# Patient Record
Sex: Female | Born: 1985 | Race: White | Hispanic: No | Smoking: Never smoker
Health system: Southern US, Community
[De-identification: ages and names within clinical notes are randomized; demographics above are authoritative.]

## PROBLEM LIST (undated history)

## (undated) DIAGNOSIS — Z87442 Personal history of urinary calculi: Secondary | ICD-10-CM

## (undated) DIAGNOSIS — E7800 Pure hypercholesterolemia, unspecified: Secondary | ICD-10-CM

## (undated) DIAGNOSIS — N1330 Unspecified hydronephrosis: Secondary | ICD-10-CM

## (undated) DIAGNOSIS — R109 Unspecified abdominal pain: Secondary | ICD-10-CM

## (undated) DIAGNOSIS — M549 Dorsalgia, unspecified: Secondary | ICD-10-CM

## (undated) DIAGNOSIS — N289 Disorder of kidney and ureter, unspecified: Secondary | ICD-10-CM

## (undated) DIAGNOSIS — N390 Urinary tract infection, site not specified: Secondary | ICD-10-CM

## (undated) DIAGNOSIS — F419 Anxiety disorder, unspecified: Secondary | ICD-10-CM

## (undated) HISTORY — DX: Unspecified hydronephrosis: N13.30

## (undated) HISTORY — DX: Anxiety disorder, unspecified: F41.9

## (undated) HISTORY — PX: STONE EXTRACTION WITH BASKET: SHX5318

## (undated) HISTORY — PX: TUBAL LIGATION: SHX77

## (undated) HISTORY — DX: Unspecified abdominal pain: R10.9

## (undated) HISTORY — DX: Pure hypercholesterolemia, unspecified: E78.00

## (undated) HISTORY — PX: ABDOMINAL HYSTERECTOMY: SHX81

## (undated) HISTORY — DX: Urinary tract infection, site not specified: N39.0

---

## 2008-07-23 ENCOUNTER — Emergency Department (HOSPITAL_COMMUNITY): Admission: EM | Admit: 2008-07-23 | Discharge: 2008-07-23 | Payer: Self-pay | Admitting: Emergency Medicine

## 2008-07-28 ENCOUNTER — Ambulatory Visit (HOSPITAL_COMMUNITY): Admission: RE | Admit: 2008-07-28 | Discharge: 2008-07-28 | Payer: Self-pay | Admitting: Urology

## 2008-07-30 ENCOUNTER — Ambulatory Visit (HOSPITAL_COMMUNITY): Admission: RE | Admit: 2008-07-30 | Discharge: 2008-07-30 | Payer: Self-pay | Admitting: Urology

## 2008-07-31 ENCOUNTER — Ambulatory Visit (HOSPITAL_COMMUNITY): Admission: RE | Admit: 2008-07-31 | Discharge: 2008-07-31 | Payer: Self-pay | Admitting: Urology

## 2008-08-04 ENCOUNTER — Ambulatory Visit (HOSPITAL_COMMUNITY): Admission: RE | Admit: 2008-08-04 | Discharge: 2008-08-04 | Payer: Self-pay | Admitting: Urology

## 2008-08-11 ENCOUNTER — Ambulatory Visit (HOSPITAL_COMMUNITY): Admission: RE | Admit: 2008-08-11 | Discharge: 2008-08-11 | Payer: Self-pay | Admitting: Urology

## 2008-08-11 ENCOUNTER — Encounter (INDEPENDENT_AMBULATORY_CARE_PROVIDER_SITE_OTHER): Payer: Self-pay | Admitting: Urology

## 2009-05-04 ENCOUNTER — Ambulatory Visit (HOSPITAL_COMMUNITY): Admission: RE | Admit: 2009-05-04 | Discharge: 2009-05-04 | Payer: Self-pay | Admitting: Urology

## 2009-11-02 ENCOUNTER — Ambulatory Visit (HOSPITAL_COMMUNITY): Admission: RE | Admit: 2009-11-02 | Discharge: 2009-11-02 | Payer: Self-pay | Admitting: Urology

## 2010-11-01 LAB — URINE MICROSCOPIC-ADD ON

## 2010-11-01 LAB — BASIC METABOLIC PANEL
BUN: 9 mg/dL (ref 6–23)
CO2: 23 mEq/L (ref 19–32)
Calcium: 9.4 mg/dL (ref 8.4–10.5)
Calcium: 9.3 mg/dL (ref 8.4–10.5)
Chloride: 105 mEq/L (ref 96–112)
Creatinine, Ser: 0.77 mg/dL (ref 0.4–1.2)
Creatinine, Ser: 0.65 mg/dL (ref 0.4–1.2)
GFR calc non Af Amer: >60 mL/min (ref >60)
Glucose, Bld: 105 mg/dL — ABNORMAL HIGH (ref 70–99)
Potassium: 4.4 mEq/L (ref 3.5–5.1)
Sodium: 134 mEq/L — ABNORMAL LOW (ref 135–145)

## 2010-11-01 LAB — URINALYSIS, ROUTINE W REFLEX MICROSCOPIC
Bilirubin Urine: NEGATIVE
Ketones, ur: NEGATIVE mg/dL
Protein, ur: 30 mg/dL — AB

## 2010-11-01 LAB — CBC
HCT: 34.7 % — ABNORMAL LOW (ref 36.0–46.0)
Hemoglobin: 11.5 g/dL — ABNORMAL LOW (ref 12.0–15.0)
RBC: 4.09 MIL/uL (ref 3.87–5.11)

## 2010-11-01 LAB — HCG, SERUM, QUALITATIVE: Preg, Serum: NEGATIVE

## 2010-11-02 ENCOUNTER — Ambulatory Visit (HOSPITAL_COMMUNITY)
Admission: RE | Admit: 2010-11-02 | Discharge: 2010-11-02 | Disposition: A | Discharge status: Home / Self Care | Payer: Managed Care, Other (non HMO) | Source: Ambulatory Visit | Attending: Urology | Admitting: Urology

## 2010-11-02 ENCOUNTER — Other Ambulatory Visit (HOSPITAL_COMMUNITY): Payer: Self-pay | Admitting: Urology

## 2010-11-02 ENCOUNTER — Encounter (HOSPITAL_COMMUNITY): Payer: Self-pay

## 2010-11-02 DIAGNOSIS — Z87442 Personal history of urinary calculi: Secondary | ICD-10-CM | POA: Insufficient documentation

## 2010-11-02 DIAGNOSIS — R1031 Right lower quadrant pain: Secondary | ICD-10-CM | POA: Insufficient documentation

## 2010-11-02 DIAGNOSIS — R109 Unspecified abdominal pain: Secondary | ICD-10-CM

## 2010-11-30 NOTE — H&P (Signed)
[name], [name]              ACCOUNT NO.:  [id number]   MEDICAL RECORD NO.:  [id number]          PATIENT TYPE:  AMB   LOCATION:  DAY                           FACILITY:  [facility]   PHYSICIAN:  Dennie Maizes, M.D.   DATE OF BIRTH:  09-12-1985   DATE OF ADMISSION:  07/30/2008  DATE OF DISCHARGE:  LH                              HISTORY & PHYSICAL   CHIEF COMPLAINT:  Left flank pain, nausea and vomiting.   HISTORY OF PRESENT ILLNESS:  This 25 year old female has past history of  recurrent urolithiasis.  She experienced sudden onset of severe left  flank pain radiating to the front associated with nausea and vomiting on  July 23, 2008.  She went to the Emergency Room at [facility].  Microscopic hematuria was noted.  She was evaluated with a noncontrast  CT scan of abdomen and pelvis.  This revealed 2 nonobstructing right  renal calculi, each measuring about 2 mm in size.  There was a 5 mm  sized left ureteral stone with obstruction and hydronephrosis.  The  patient was unable to pass the stone.  She has some pain relief at  present.  Repeat x-ray of the KUB area revealed a 6 x 5 mm sized stone  in the left ureterovesical junction area.  The patient is brought to the  short-stay center today for extracorporeal shock wave lithotripsy of  obstructing left distal ureteral calculus.   PAST MEDICAL HISTORY:  1. Recurrent urolithiasis.  2. Status post tubal ligation.   MEDICATIONS:  Hydrocodone with APAP, Flomax.   ALLERGIES:  None.   FAMILY HISTORY:  Positive for lung cancer.   PHYSICAL EXAMINATION:  GENERAL:  The patient is comfortable at time of  examination.  Height is 5 feet 2 inches, weight 205 pounds.  HEENT:  Normal.  LUNGS:  Clear to auscultation.  HEART:  Regular rate and rhythm.  No murmurs.  ABDOMEN:  Soft, no palpable flank mass.  Mild left costovertebral angle  tenderness is noted.  Bladder not palpable.   IMPRESSION:  Left distal ureteral calculus with  obstruction with 6 x 5  mm in size, left hydronephrosis, left flank pain, nonobstructing right  renal calculi.   PLAN:  Extracorporeal shock wave lithotripsy of obstructing left distal  ureteral calculus with IV sedation in the hospital.  I informed the  patient regarding diagnosis, operative details, alternative treatments,  outcome, possible risks and complications.  She has agreed for the  procedure to be done.  If she is unable to pass the stone fragments, she  may need ureteroscopy stone extraction.  This was explained to the  patient in detail.      Dennie Maizes, M.D.  Electronically Signed     SK/MEDQ  D:  07/29/2008  T:  07/29/2008  Job:  [id number]   cc:   [facility], [facility]

## 2010-11-30 NOTE — H&P (Signed)
NAMEKRISTYNA, Kirk NO.:  [id number]   MEDICAL RECORD NO.:  [id number]          PATIENT TYPE:  AMB   LOCATION:  DAY                           FACILITY:  [facility]   PHYSICIAN:  Dennie Maizes, M.D.   DATE OF BIRTH:  May 15, 1986   DATE OF ADMISSION:  DATE OF DISCHARGE:  LH                              HISTORY & PHYSICAL   She is coming to the Short Stay Center on 08/11/2008.   CHIEF COMPLAINT:  Left flank pain, left distal ureteral calculus.   HISTORY OF PRESENT ILLNESS:  This 25 year old female has a past history  of recurrent urolithiasis.  She was evaluated for left flank pain.  CT  scan of abdomen and pelvis revealed a 6-mm size left distal ureteral  calculus obstruction and hydronephrosis.  The patient has been treated  with ESL of the stone on July 30, 2008.  Follow-up x-rays revealed  that the stone has not been broken well.  She has not passed any stone  fragment in the urine.  She continues to have intermittent left flank  pain.  I discussed with the patient regarding management options.  She  has decided to undergo cystoscopy, left retrograde pyelogram, left  ureteroscopy, stone extraction and stent placement.  She denied having  any fever, chills, voiding difficulty or gross hematuria at present.   PAST MEDICAL HISTORY:  History of recurrent urolithiasis, status post  tubal ligation.   MEDICATIONS:  Cipro, hydrocodone with APAP.   ALLERGIES:  None.   FAMILY HISTORY:  Positive for lung cancer   EXAMINATION:  HEAD/EYES/EARS/NOSE AND THROAT:  Normal.  LUNGS:  Clear to auscultation.  HEART:  Regular rate and rhythm, no murmurs.  ABDOMEN:  Soft, no palpable flank mass.  Mild left costovertebral angle  tenderness was noted.  Bladder not palpable.   IMPRESSION:  Left distal ureteral calculus with obstruction (6-mm x 5-  mm) post ESL, left hydronephrosis, left renal colic.   PLAN:  Cystoscopy, left retrograde pyelogram, left ureteroscopy stone  extraction with stent placement under anesthesia in the Short Stay  Center.  I have informed the patient regarding the diagnosis, operative  details, alternate treatments and outcome, possible risks and  complications and she has agreed for the procedure to be done.      Dennie Maizes, M.D.  Electronically Signed     SK/MEDQ  D:  08/10/2008  T:  08/10/2008  Job:  [id number]   cc:   [facility]

## 2010-11-30 NOTE — Op Note (Signed)
[name], [name]              ACCOUNT NO.:  [id number]   MEDICAL RECORD NO.:  [id number]          PATIENT TYPE:  AMB   LOCATION:  DAY                           FACILITY:  [facility]   PHYSICIAN:  Dennie Maizes, M.D.   DATE OF BIRTH:  07-17-1986   DATE OF PROCEDURE:  08/11/2008  DATE OF DISCHARGE:                               OPERATIVE REPORT   PREOPERATIVE DIAGNOSES:  Left distal ureteral calculus with obstruction  with left renal colic and left hydronephrosis.   POSTOPERATIVE DIAGNOSES:  Left distal ureteral calculus with obstruction  with left renal colic and left hydronephrosis.   OPERATIVE PROCEDURES:  Cystoscopy, left retrograde pyelogram, left  ureteroscopic stone extraction, and left ureteral stent placement.   ANESTHESIA:  General.   SURGEON:  Dennie Maizes, MD   ESTIMATED BLOOD LOSS:  Minimal.   DRAINS:  6 French x 26 cm left ureteric stent with a string.   SPECIMEN:  Left ureteral calculus which was sent for chemical analysis.   COMPLICATIONS:  None.   INDICATIONS FOR PROCEDURE:  This 25 year old female has left flank pain.  Evaluation revealed a 6 x 10 mm left distal ureteral calculus with  obstructive hydronephrosis.  She was treated with ESWL on the distal  ureteral calculus about 2 weeks ago.  The stone was not fragmented and  she has persistent pain.  She was brought to the day hospital today for  cystoscopy, left retrograde pyelogram, left ureteroscopy, stone  extraction, and stent placement.   DESCRIPTION OF THE PROCEDURE:  General anesthesia was induced and the  patient was placed on the OR table in the dorsal lithotomy position.  The lower abdomen and genitalia were prepped and draped in a sterile  fashion.  Cystoscopy was done with a 22-French scope.  At first, the  bladder was normal.  The stone was found to be protruding from the left  ureteral orifice.  Stone was impacted in the left ureteral orifice.  I  tried to pass a guidewire and I was  unsuccessful.  The cystoscope was  removed.  A8.5-French rigid ureteroscope was then inserted into the  bladder.  With the tip of the instrument the stone was pushed into the l  ureter.  Under direct vision, a 0.038 inch  Bentson guidewire was  inserted and then advanced in the left renal pelvis.  Distal ureter was  then dilated using 18-French balloon dilation catheter.  The  ureteroscope was then reinserted into the bladder.  The stone was seen  at the level about 1 cm above the ureteral orifice.  The stone was  engaged in a wire basket and removed without any difficulty.  An open-  ended catheter was then passed over the guidewire.  The collecting  system was filled with contrast and a retrograde pyelogram was done.  With fluoroscopy guidance, a guidewire was then inserted into the  left renal pelvis.  A 6 French x 26 cm stent with a string was inserted  over the guidewire and placed in the left collecting system.  The  instruments were removed.  The patient was then transferred to  the PACU  in a satisfactory condition.      Dennie Maizes, M.D.  Electronically Signed     SK/MEDQ  D:  08/11/2008  T:  08/12/2008  Job:  [id number]

## 2011-07-26 ENCOUNTER — Other Ambulatory Visit (HOSPITAL_COMMUNITY): Payer: Self-pay | Admitting: Urology

## 2011-07-26 ENCOUNTER — Ambulatory Visit (HOSPITAL_COMMUNITY)
Admission: RE | Admit: 2011-07-26 | Discharge: 2011-07-26 | Disposition: A | Discharge status: Home / Self Care | Payer: Managed Care, Other (non HMO) | Source: Ambulatory Visit | Attending: Urology | Admitting: Urology

## 2011-07-26 DIAGNOSIS — Z87442 Personal history of urinary calculi: Secondary | ICD-10-CM | POA: Insufficient documentation

## 2011-07-26 DIAGNOSIS — R109 Unspecified abdominal pain: Secondary | ICD-10-CM

## 2011-07-26 DIAGNOSIS — M545 Low back pain, unspecified: Secondary | ICD-10-CM | POA: Insufficient documentation

## 2011-08-03 ENCOUNTER — Other Ambulatory Visit (HOSPITAL_COMMUNITY): Payer: Managed Care, Other (non HMO)

## 2011-08-04 ENCOUNTER — Ambulatory Visit (HOSPITAL_COMMUNITY)
Admission: RE | Admit: 2011-08-04 | Discharge: 2011-08-04 | Disposition: A | Discharge status: Home / Self Care | Payer: Managed Care, Other (non HMO) | Source: Ambulatory Visit | Attending: Urology | Admitting: Urology

## 2011-08-04 DIAGNOSIS — R1031 Right lower quadrant pain: Secondary | ICD-10-CM | POA: Insufficient documentation

## 2011-08-04 DIAGNOSIS — R109 Unspecified abdominal pain: Secondary | ICD-10-CM

## 2011-08-04 DIAGNOSIS — N200 Calculus of kidney: Secondary | ICD-10-CM | POA: Insufficient documentation

## 2011-08-04 DIAGNOSIS — R1032 Left lower quadrant pain: Secondary | ICD-10-CM | POA: Insufficient documentation

## 2011-09-17 ENCOUNTER — Emergency Department (HOSPITAL_COMMUNITY): Payer: Managed Care, Other (non HMO)

## 2011-09-17 ENCOUNTER — Encounter (HOSPITAL_COMMUNITY): Payer: Self-pay

## 2011-09-17 ENCOUNTER — Emergency Department (HOSPITAL_COMMUNITY)
Admission: EM | Admit: 2011-09-17 | Discharge: 2011-09-17 | Disposition: A | Discharge status: Home / Self Care | Payer: Managed Care, Other (non HMO) | Attending: Emergency Medicine | Admitting: Emergency Medicine

## 2011-09-17 DIAGNOSIS — M549 Dorsalgia, unspecified: Secondary | ICD-10-CM | POA: Insufficient documentation

## 2011-09-17 DIAGNOSIS — R109 Unspecified abdominal pain: Secondary | ICD-10-CM | POA: Insufficient documentation

## 2011-09-17 DIAGNOSIS — R110 Nausea: Secondary | ICD-10-CM | POA: Insufficient documentation

## 2011-09-17 DIAGNOSIS — R10819 Abdominal tenderness, unspecified site: Secondary | ICD-10-CM | POA: Insufficient documentation

## 2011-09-17 DIAGNOSIS — R509 Fever, unspecified: Secondary | ICD-10-CM | POA: Insufficient documentation

## 2011-09-17 HISTORY — DX: Disorder of kidney and ureter, unspecified: N28.9

## 2011-09-17 LAB — URINALYSIS, ROUTINE W REFLEX MICROSCOPIC
Bilirubin Urine: NEGATIVE
Hgb urine dipstick: NEGATIVE
Ketones, ur: NEGATIVE mg/dL
Nitrite: NEGATIVE
Specific Gravity, Urine: 1.025 (ref 1.005–1.030)
Urobilinogen, UA: 0.2 mg/dL (ref 0.0–1.0)
pH: 6.5 (ref 5.0–8.0)

## 2011-09-17 LAB — URINE MICROSCOPIC-ADD ON

## 2011-09-17 MED ORDER — SODIUM CHLORIDE 0.9 % IV SOLN
Freq: Once | INTRAVENOUS | Status: AC
  Administered 2011-09-17: 18:00:00 via INTRAVENOUS

## 2011-09-17 MED ORDER — ONDANSETRON HCL 4 MG/2ML IJ SOLN
4.0000 mg | Freq: Once | INTRAMUSCULAR | Status: AC
  Administered 2011-09-17: 4 mg via INTRAVENOUS
  Filled 2011-09-17: qty 2

## 2011-09-17 MED ORDER — HYDROCODONE-ACETAMINOPHEN 5-325 MG PO TABS: 1.0000 | ORAL_TABLET | Freq: Four times a day (QID) | ORAL | Status: AC | PRN

## 2011-09-17 MED ORDER — CIPROFLOXACIN HCL 500 MG PO TABS: 500.0000 mg | ORAL_TABLET | Freq: Two times a day (BID) | ORAL | Status: AC

## 2011-09-17 MED ORDER — CIPROFLOXACIN HCL 250 MG PO TABS
500.0000 mg | ORAL_TABLET | Freq: Once | ORAL | Status: AC
  Administered 2011-09-17: 500 mg via ORAL
  Filled 2011-09-17: qty 2

## 2011-09-17 MED ORDER — PROMETHAZINE HCL 25 MG PO TABS: 25.0000 mg | ORAL_TABLET | Freq: Four times a day (QID) | ORAL | Status: DC | PRN

## 2011-09-17 MED ORDER — HYDROMORPHONE HCL PF 1 MG/ML IJ SOLN
1.0000 mg | Freq: Once | INTRAMUSCULAR | Status: AC
  Administered 2011-09-17: 1 mg via INTRAVENOUS
  Filled 2011-09-17: qty 1

## 2011-09-17 NOTE — ED Notes (Signed)
Complain of pain in left flank and nausea. Has history of kidney stones

## 2011-09-17 NOTE — ED Notes (Signed)
Patient transported to X-ray 

## 2011-09-17 NOTE — Discharge Instructions (Signed)
Follow up with dr. javaid next week °

## 2011-09-17 NOTE — ED Notes (Signed)
Pt with known kidney stone to left flank but today has pain to right flank as well but not as much as left, +nausea, denies vomiting or diarrhea, states two CT's as of Jan. 2013

## 2011-09-17 NOTE — ED Provider Notes (Signed)
History    Scribed for Angela Lennert, MD, the patient was seen in room [facility]17/[facility]. This chart was scribed by [name]. Pt was seen at 5:50 PM.    CSN: [id number]  Arrival date & time 09/17/11  1647   First MD Initiated Contact with Patient 09/17/11 1747      Chief Complaint  Patient presents with  . Flank Pain    (Consider location/radiation/quality/duration/timing/severity/associated sxs/prior treatment) Patient is a 26 y.o. female presenting with flank pain.  Flank Pain This is a recurrent problem. The current episode started 3 to 5 hours ago. Episode frequency: intermittently. The problem has not changed since onset.Pertinent negatives include no chest pain, no abdominal pain and no headaches. Associated symptoms comments: Back pain. Treatments tried: ibuprofen.   Patient rpeorts acute onset of back pain around 3 PM today.  Patint took Ipuprofen.  Patient with history of kidney stones.  Pain in lower left side back pain and left flank.  Pain described as sharp and stabbing pain.  Patient reports nausea, fever and chills.   Patient denies urinary frequency.  She was been drinking 100% craneberry juice.      Past Medical History  Diagnosis Date  . Renal disorder     Past Surgical History  Procedure Date  . Tubal ligation     History reviewed. No pertinent family history.  History  Substance Use Topics  . Smoking status: Never Smoker   . Smokeless tobacco: Not on file  . Alcohol Use: No    OB History    Grav Para Term Preterm Abortions TAB SAB Ect Mult Living                  Review of Systems  Constitutional: Positive for fever and chills. Negative for fatigue.  HENT: Negative for congestion, sinus pressure and ear discharge.   Eyes: Negative for discharge.  Respiratory: Negative for cough.   Cardiovascular: Negative for chest pain.  Gastrointestinal: Positive for nausea. Negative for abdominal pain and diarrhea.  Genitourinary: Positive for flank  pain. Negative for frequency, hematuria and difficulty urinating.  Musculoskeletal: Positive for back pain.  Skin: Negative for rash.  Neurological: Negative for seizures and headaches.  Hematological: Negative.   Psychiatric/Behavioral: Negative for hallucinations.    Allergies  Review of patient's allergies indicates no known allergies.  Home Medications   Current Outpatient Rx  Name Route Sig Dispense Refill  . IBUPROFEN 200 MG PO TABS Oral Take 800 mg by mouth every 6 (six) hours as needed. pain    . ADULT MULTIVITAMIN W/MINERALS CH Oral Take 1 tablet by mouth daily.    Marland Kitchen POTASSIUM CITRATE ER 10 MEQ (1080 MG) PO TBCR Oral Take 10 mEq by mouth daily.      BP 124/65  Pulse 91  Temp(Src) 99.9 F (37.7 C) (Oral)  Resp 20  Ht 5\' 2"  (1.575 m)  Wt 205 lb (92.987 kg)  BMI 37.49 kg/m2  SpO2 98%  LMP 09/04/2011  Physical Exam  Constitutional: She is oriented to person, place, and time. She appears well-developed.  HENT:  Head: Normocephalic and atraumatic.  Eyes: Conjunctivae and EOM are normal. No scleral icterus.  Neck: Neck supple. No thyromegaly present.  Cardiovascular: Normal rate and regular rhythm.  Exam reveals no gallop and no friction rub.   No murmur heard. Pulmonary/Chest: No stridor. She has no wheezes. She has no rales. She exhibits no tenderness.  Abdominal: She exhibits no distension. There is no tenderness. There is no  rebound.  Musculoskeletal: Normal range of motion. She exhibits tenderness. She exhibits no edema.       Moderately tender in left flank  Lymphadenopathy:    She has no cervical adenopathy.  Neurological: She is oriented to person, place, and time. Coordination normal.  Skin: No rash noted. No erythema.  Psychiatric: She has a normal mood and affect. Her behavior is normal.    ED Course  Procedures (including critical care time)   DIAGNOSTIC STUDIES: Oxygen Saturation is 96% on room air, normal by my interpretation.     COORDINATION  OF CARE: 5:58 PM  Physical exam complete.   6:00 PM  Dilaudid and Zofran ordered.  IV fluids.   8:50 PM  Recheck.  Radiological and laboratory findings discussed with patient.  8:54 PM  Plan to discharge patient.  Patient agrees with plan.       LABS / RADIOLOGY:   Labs Reviewed  URINALYSIS, ROUTINE W REFLEX MICROSCOPIC - Abnormal; Notable for the following:    APPearance CLOUDY (*)    Leukocytes, UA SMALL (*)    All other components within normal limits  URINE MICROSCOPIC-ADD ON - Abnormal; Notable for the following:    Squamous Epithelial / LPF MANY (*)    Bacteria, UA MANY (*)    All other components within normal limits  PREGNANCY, URINE  URINE CULTURE   Results for orders placed during the hospital encounter of 09/17/11  URINALYSIS, ROUTINE W REFLEX MICROSCOPIC      Component Value Range   Color, Urine YELLOW  YELLOW    APPearance CLOUDY (*) CLEAR    Specific Gravity, Urine 1.025  1.005 - 1.030    pH 6.5  5.0 - 8.0    Glucose, UA NEGATIVE  NEGATIVE (mg/dL)   Hgb urine dipstick NEGATIVE  NEGATIVE    Bilirubin Urine NEGATIVE  NEGATIVE    Ketones, ur NEGATIVE  NEGATIVE (mg/dL)   Protein, ur NEGATIVE  NEGATIVE (mg/dL)   Urobilinogen, UA 0.2  0.0 - 1.0 (mg/dL)   Nitrite NEGATIVE  NEGATIVE    Leukocytes, UA SMALL (*) NEGATIVE   PREGNANCY, URINE      Component Value Range   Preg Test, Ur NEGATIVE  NEGATIVE   URINE MICROSCOPIC-ADD ON      Component Value Range   Squamous Epithelial / LPF MANY (*) RARE    WBC, UA 7-10  <3 (WBC/hpf)   Bacteria, UA MANY (*) RARE     Ct Abdomen Pelvis Wo Contrast  09/17/2011  *RADIOLOGY REPORT*  Clinical Data: Evaluate for kidney stone  CT ABDOMEN AND PELVIS WITHOUT CONTRAST  Technique:  Multidetector CT imaging of the abdomen and pelvis was performed following the standard protocol without intravenous contrast.  Comparison: 08/04/2011  Findings: Lung bases are clear.  No pericardial or pleural effusion.  No focal liver abnormality.  The  spleen appears normal.  The adrenal glands are both normal.  Gallbladder negative.  No biliary dilatation.  The pancreas is normal. Tiny nonobstructing calculus is noted within the upper pole the right kidney measuring approximately 2 mm.  Small nonobstructing stone within the inferior pole of the left kidney measures 2 mm.  No hydronephrosis or hydroureter noted.  No ureterolithiasis identified.  Urinary bladder appears normal.  The small bowel loops are negative.  The appendix is identified and appears normal.  Colon is negative.  Review of the visualized osseous structures is unremarkable.  IMPRESSION:  1.  Bilateral nonobstructing renal calculi measure up to 2 mm. 2.  No ureterolithiasis or bladder calculi noted.  Original Report Authenticated By: Rosealee Albee, M.D.   Dg Abd 1 View  09/17/2011  *RADIOLOGY REPORT*  Clinical Data: flank pain  ABDOMEN - 1 VIEW  Comparison: 08/04/2011  Findings:  The bowel gas pattern appears nonobstructed.  No dilated loops of small bowel or fluid levels identified.  Moderate stool burden is identified within the right side of the colon.  IMPRESSION:  1.  Nonobstructive bowel gas pattern.  Original Report Authenticated By: Rosealee Albee, M.D.         MDM  Flank pain,  Possible uti and passed stone       MEDICATIONS GIVEN IN THE E.D. Scheduled Meds:    . sodium chloride   Intravenous Once  .  HYDROmorphone (DILAUDID) injection  1 mg Intravenous Once  . ondansetron  4 mg Intravenous Once   Continuous Infusions:      IMPRESSION: No diagnosis found.   NEW MEDICATIONS: New Prescriptions   No medications on file      The chart was scribed for me under my direct supervision.  I personally performed the history, physical, and medical decision making and all procedures in the evaluation of this patient.Angela Lennert, MD 09/17/11 [phone]

## 2011-09-19 LAB — URINE CULTURE

## 2011-11-11 ENCOUNTER — Emergency Department (HOSPITAL_COMMUNITY)
Admission: EM | Admit: 2011-11-11 | Discharge: 2011-11-11 | Disposition: A | Discharge status: Home / Self Care | Payer: Managed Care, Other (non HMO) | Attending: Emergency Medicine | Admitting: Emergency Medicine

## 2011-11-11 ENCOUNTER — Encounter (HOSPITAL_COMMUNITY): Payer: Self-pay | Admitting: *Deleted

## 2011-11-11 ENCOUNTER — Emergency Department (HOSPITAL_COMMUNITY): Payer: Managed Care, Other (non HMO)

## 2011-11-11 DIAGNOSIS — I10 Essential (primary) hypertension: Secondary | ICD-10-CM | POA: Insufficient documentation

## 2011-11-11 DIAGNOSIS — R109 Unspecified abdominal pain: Secondary | ICD-10-CM | POA: Insufficient documentation

## 2011-11-11 DIAGNOSIS — G43909 Migraine, unspecified, not intractable, without status migrainosus: Secondary | ICD-10-CM | POA: Insufficient documentation

## 2011-11-11 LAB — BASIC METABOLIC PANEL
CO2: 26 mEq/L (ref 19–32)
Glucose, Bld: 109 mg/dL — ABNORMAL HIGH (ref 70–99)
Potassium: 3.6 mEq/L (ref 3.5–5.1)
Sodium: 136 mEq/L (ref 135–145)

## 2011-11-11 LAB — URINALYSIS, ROUTINE W REFLEX MICROSCOPIC
Bilirubin Urine: NEGATIVE
Glucose, UA: NEGATIVE mg/dL
Leukocytes, UA: NEGATIVE
Nitrite: NEGATIVE
Specific Gravity, Urine: 1.015 (ref 1.005–1.030)
pH: 6.5 (ref 5.0–8.0)

## 2011-11-11 LAB — URINE MICROSCOPIC-ADD ON

## 2011-11-11 MED ORDER — SODIUM CHLORIDE 0.9 % IV SOLN
Freq: Once | INTRAVENOUS | Status: AC
  Administered 2011-11-11: 06:00:00 via INTRAVENOUS

## 2011-11-11 MED ORDER — ONDANSETRON HCL 4 MG/2ML IJ SOLN
4.0000 mg | Freq: Once | INTRAMUSCULAR | Status: AC
  Administered 2011-11-11: 4 mg via INTRAVENOUS
  Filled 2011-11-11: qty 2

## 2011-11-11 MED ORDER — DIPHENHYDRAMINE HCL 50 MG/ML IJ SOLN
25.0000 mg | Freq: Once | INTRAMUSCULAR | Status: AC
  Administered 2011-11-11: 25 mg via INTRAVENOUS
  Filled 2011-11-11: qty 1

## 2011-11-11 MED ORDER — OXYCODONE-ACETAMINOPHEN 5-325 MG PO TABS: 2.0000 | ORAL_TABLET | ORAL | Status: AC | PRN

## 2011-11-11 MED ORDER — PROMETHAZINE HCL 25 MG/ML IJ SOLN
25.0000 mg | Freq: Once | INTRAMUSCULAR | Status: AC
  Administered 2011-11-11: 25 mg via INTRAVENOUS
  Filled 2011-11-11: qty 1

## 2011-11-11 MED ORDER — HYDROMORPHONE HCL PF 1 MG/ML IJ SOLN
1.0000 mg | Freq: Once | INTRAMUSCULAR | Status: AC
  Administered 2011-11-11: 1 mg via INTRAVENOUS
  Filled 2011-11-11: qty 1

## 2011-11-11 MED ORDER — METHOCARBAMOL 100 MG/ML IJ SOLN
1000.0000 mg | INTRAVENOUS | Status: AC
  Administered 2011-11-11: 1000 mg via INTRAVENOUS
  Filled 2011-11-11: qty 10

## 2011-11-11 MED ORDER — OXYCODONE-ACETAMINOPHEN 5-325 MG PO TABS
2.0000 | ORAL_TABLET | Freq: Once | ORAL | Status: AC
  Administered 2011-11-11: 2 via ORAL
  Filled 2011-11-11: qty 2

## 2011-11-11 MED ORDER — LORAZEPAM 1 MG PO TABS
2.0000 mg | ORAL_TABLET | Freq: Once | ORAL | Status: AC
  Administered 2011-11-11: 2 mg via ORAL
  Filled 2011-11-11 (×2): qty 2

## 2011-11-11 MED ORDER — KETOROLAC TROMETHAMINE 30 MG/ML IJ SOLN
30.0000 mg | Freq: Once | INTRAMUSCULAR | Status: AC
  Administered 2011-11-11: 30 mg via INTRAVENOUS
  Filled 2011-11-11: qty 1

## 2011-11-11 MED ORDER — SODIUM CHLORIDE 0.9 % IV BOLUS (SEPSIS)
1000.0000 mL | Freq: Once | INTRAVENOUS | Status: AC
  Administered 2011-11-11: 1000 mL via INTRAVENOUS

## 2011-11-11 NOTE — ED Provider Notes (Signed)
History    This chart was scribed for Felisa Bonier, MD, MD by Smitty Pluck. The patient was seen in room APA08/APA08 and the patient's care was started at 8:58PM.   CSN: [id number]  Arrival date & time 11/11/11  0506   First MD Initiated Contact with Patient 11/11/11 0535      Chief Complaint  Patient presents with  . Flank Pain    (Consider location/radiation/quality/duration/timing/severity/associated sxs/prior treatment) HPI Comments: The patient has a history of anxiety and reports that she is feeling very anxious, but appears to be in no distress, with logical thought process, preserved insight and judgment, awake, alert, and oriented to person, place, time, and event, and appears to be no threat to herself or others. A dose of anxiolytic has been ordered in the emergency department. Patient reports generalized bifrontal headache, dull, throbbing, tight like a band, radiating to the occiput, moderate in severity, similar to prior headaches, without visual changes, auditory changes, with mild nausea but no vomiting, without neck stiffness, with no preceding head injury, and of gradual onset that she says started 2 months ago, waxing and waning course, currently with mild and gradual worsening reported in the emergency department. She has a nonfocal neurologic examination.  Patient is a 26 y.o. female presenting with migraine. The history is provided by the patient.  Migraine This is a recurrent problem. Episode onset: 2 months ago. The problem occurs daily (waxing/waning). The problem has been gradually worsening (gradual onset, waxing and waning, gradually worsening). Associated symptoms include headaches. Pertinent negatives include no chest pain, no abdominal pain and no shortness of breath. Exacerbated by: bright light, loud noise, emotional stress. Treatments tried: hydrocodone. The treatment provided mild relief.   [name] is a 26 y.o. female who presents to the Emergency  Department complaining of moderate aching bilateral flank pain and migraine headache similar to prior headaches. Pt reports that she has been having migraines for past 2 months. Symptoms have been constant without radiation.  Pt reports taking hydrocodone before coming to the ER with minor relief. Pt has hx of kidney stones.   Past Medical History  Diagnosis Date  . Renal disorder   . Hypertension     Past Surgical History  Procedure Date  . Tubal ligation   . Stone extraction with basket     History reviewed. No pertinent family history.  History  Substance Use Topics  . Smoking status: Never Smoker   . Smokeless tobacco: Not on file  . Alcohol Use: No    OB History    Grav Para Term Preterm Abortions TAB SAB Ect Mult Living                  Review of Systems  Respiratory: Negative for shortness of breath.   Cardiovascular: Negative for chest pain.  Gastrointestinal: Negative for abdominal pain.  Neurological: Positive for headaches.  All other systems reviewed and are negative.   10 Systems reviewed and all are negative for acute change except as noted in the HPI.   Allergies  Review of patient's allergies indicates no known allergies.  Home Medications   Current Outpatient Rx  Name Route Sig Dispense Refill  . HYDROCODONE-ACETAMINOPHEN 5-325 MG PO TABS Oral Take 1 tablet by mouth every 6 (six) hours as needed.    . IBUPROFEN 200 MG PO TABS Oral Take 800 mg by mouth every 6 (six) hours as needed. pain    . ADULT MULTIVITAMIN W/MINERALS CH Oral Take 1  tablet by mouth daily.    Marland Kitchen POTASSIUM CITRATE ER 10 MEQ (1080 MG) PO TBCR Oral Take 10 mEq by mouth daily.    Marland Kitchen PROMETHAZINE HCL 25 MG PO TABS Oral Take 1 tablet (25 mg total) by mouth every 6 (six) hours as needed for nausea. 15 tablet 0    BP 112/73  Pulse 80  Temp(Src) 98.3 F (36.8 C) (Oral)  Resp 18  Ht 5\' 2"  (1.575 m)  Wt 218 lb (98.884 kg)  BMI 39.87 kg/m2  SpO2 98%  LMP 10/20/2011  Physical Exam    Nursing note and vitals reviewed. Constitutional: She is oriented to person, place, and time. She appears well-developed and well-nourished. No distress.  HENT:  Head: Normocephalic and atraumatic.  Right Ear: External ear normal.  Left Ear: External ear normal.  Mouth/Throat: Oropharynx is clear and moist. No oropharyngeal exudate.  Eyes: Conjunctivae and EOM are normal. Pupils are equal, round, and reactive to light. Right eye exhibits no nystagmus. Left eye exhibits no nystagmus.  Fundoscopic exam:      The right eye shows no papilledema.       The left eye shows no papilledema.       visual fields intact  No papilladema   Neck: Normal range of motion, full passive range of motion without pain and phonation normal. Neck supple. Carotid bruit is not present. No thyromegaly present.  Cardiovascular: Normal rate, regular rhythm, normal heart sounds and intact distal pulses.  Exam reveals no gallop and no friction rub.   No murmur heard. Pulmonary/Chest: Effort normal and breath sounds normal. No respiratory distress. She has no wheezes. She has no rales.  Abdominal: Soft. Bowel sounds are normal. She exhibits no distension. There is no tenderness. There is no rebound and no guarding.  Musculoskeletal: Normal range of motion. She exhibits no edema and no tenderness.  Neurological: She is alert and oriented to person, place, and time. She has normal reflexes. No cranial nerve deficit. She exhibits normal muscle tone. Coordination normal. GCS eye subscore is 4. GCS verbal subscore is 5. GCS motor subscore is 6.       2+ symetric bilateral reflexes upper and lower extremities, visual fields are intact confrontation, normal coordination bilaterally by finger-nose-finger and heel shin tests.  Nonfocal neurologic examination.   Skin: Skin is warm and dry. No rash noted. She is not diaphoretic.  Psychiatric: She has a normal mood and affect. Her behavior is normal. Judgment and thought content  normal.    ED Course  Procedures (including critical care time) DIAGNOSTIC STUDIES: Oxygen Saturation is 98% on room air, normal by my interpretation.    COORDINATION OF CARE: 9:19AM EDP ordered medication: ativan 2 mg, percocet 2 tablet, NaCl 0.9% bolus, phenergan 25 mg, benadryl 25 mg   Labs Reviewed  URINALYSIS, ROUTINE W REFLEX MICROSCOPIC - Abnormal; Notable for the following:    Hgb urine dipstick TRACE (*)    All other components within normal limits  PREGNANCY, URINE  URINE MICROSCOPIC-ADD ON  URINE CULTURE  BASIC METABOLIC PANEL   Dg Abd 1 View  11/11/2011  *RADIOLOGY REPORT*  Clinical Data: Bilateral flank pain.  History of stones.  ABDOMEN - 1 VIEW  Comparison: 09/17/2011.  Findings: Question tiny left renal calculi?Marland Kitchen  Nonspecific bowel gas pattern. The possibility of free intraperitoneal air cannot be addressed on a supine view.  IMPRESSION: Question tiny left renal calculi.  Original Report Authenticated By: Fuller Canada, M.D.     1. Flank  pain       MDM  The patient is insistent on having imaging to evaluate for possible kidney stone. I discussed with her that she has had frequent CT scans in the last few months (2 scans in the last 3 months) and that at this time without sign of kidney stone in her urine, with objectively controlled symptoms, and if her kidney function appears normal and basic metabolic profile, there is no indication that further intervention would be needed other than symptom control. Additionally, she is being referred to urology for followup evaluation of this issue regardless of a CT scan which is the same treatment we would offer if we found a large and obstructing ureteric stone. She requests a plain film of the abdomen which I think is reasonable and will perform. The plain film in the abdomen has been performed and I have reviewed it, and it might show a tiny intrarenal kidney stone but no sign of ureteral stone. I have ordered her anxiety  for reported anxiety, headache cocktail for her reported headache, Percocet for the reported flank pain, and if her renal function is normal she appears to be in no distress will be discharged home to followup with urology. She states her understanding of and agreement with the plan of care.  I personally performed the services described in this documentation, which was scribed in my presence. The recorded information has been reviewed and considered.        Felisa Bonier, MD 11/11/11 1001

## 2011-11-11 NOTE — ED Provider Notes (Signed)
History     CSN: [id number]  Arrival date & time 11/11/11  0506   First MD Initiated Contact with Patient 11/11/11 0535      Chief Complaint  Patient presents with  . Flank Pain    (Consider location/radiation/quality/duration/timing/severity/associated sxs/prior treatment) Patient is a 26 y.o. female presenting with flank pain. The history is provided by the patient.  Flank Pain  She has a history of frequent urinary infections. For the last 2 days, she has had mild bilateral flank pain which has been getting gradually worse. This morning at 3 AM, she was awakened with severe bilateral flank pain. Pain is rated at 9/10. Nothing makes it better nothing makes worse. She tried taking a dose of hydrocodone which made her nauseous but did not help the pain at all. She's not had any fever, chills, sweats. She has noted urinary urgency and frequency but no dysuria no hematuria. She has been in the emergency department several other times this year with similar complaints due to urinary infections.  Past Medical History  Diagnosis Date  . Renal disorder   . Hypertension     Past Surgical History  Procedure Date  . Tubal ligation   . Stone extraction with basket     History reviewed. No pertinent family history.  History  Substance Use Topics  . Smoking status: Never Smoker   . Smokeless tobacco: Not on file  . Alcohol Use: No    OB History    Grav Para Term Preterm Abortions TAB SAB Ect Mult Living                  Review of Systems  Genitourinary: Positive for flank pain.  All other systems reviewed and are negative.    Allergies  Review of patient's allergies indicates no known allergies.  Home Medications   Current Outpatient Rx  Name Route Sig Dispense Refill  . HYDROCODONE-ACETAMINOPHEN 5-325 MG PO TABS Oral Take 1 tablet by mouth every 6 (six) hours as needed.    . IBUPROFEN 200 MG PO TABS Oral Take 800 mg by mouth every 6 (six) hours as needed. pain    .  ADULT MULTIVITAMIN W/MINERALS CH Oral Take 1 tablet by mouth daily.    Marland Kitchen POTASSIUM CITRATE ER 10 MEQ (1080 MG) PO TBCR Oral Take 10 mEq by mouth daily.    Marland Kitchen PROMETHAZINE HCL 25 MG PO TABS Oral Take 1 tablet (25 mg total) by mouth every 6 (six) hours as needed for nausea. 15 tablet 0    BP 135/80  Pulse 95  Temp(Src) 98.3 F (36.8 C) (Oral)  Resp 18  Ht 5\' 2"  (1.575 m)  Wt 218 lb (98.884 kg)  BMI 39.87 kg/m2  SpO2 100%  LMP 10/20/2011  Physical Exam  Nursing note and vitals reviewed.  26 year old female who is resting comfortably and in no acute distress. Vital signs are normal. Oxygen saturation is 100% which is normal. Head is normocephalic and atraumatic. PERRLA, EOMI. Oropharynx is clear. Neck is nontender and supple. Back is nontender. There is bilateral CVA tenderness which is moderate. Lungs are clear without rales, wheezes, or rhonchi. Heart has regular rate and rhythm without murmur. Abdomen soft, flat, nontender without masses or hepatosplenomegaly. Extremities have trace edema, no cyanosis. Full range of motion is present. Skin is warm and dry without rash. Neurologic: Mental status is normal, cranial nerves are intact, there are no focal motor or sensory deficits.  ED Course  Procedures (including critical care time)  Results for orders placed during the hospital encounter of 11/11/11  URINALYSIS, ROUTINE W REFLEX MICROSCOPIC      Component Value Range   Color, Urine YELLOW  YELLOW    APPearance CLEAR  CLEAR    Specific Gravity, Urine 1.015  1.005 - 1.030    pH 6.5  5.0 - 8.0    Glucose, UA NEGATIVE  NEGATIVE (mg/dL)   Hgb urine dipstick TRACE (*) NEGATIVE    Bilirubin Urine NEGATIVE  NEGATIVE    Ketones, ur NEGATIVE  NEGATIVE (mg/dL)   Protein, ur NEGATIVE  NEGATIVE (mg/dL)   Urobilinogen, UA 0.2  0.0 - 1.0 (mg/dL)   Nitrite NEGATIVE  NEGATIVE    Leukocytes, UA NEGATIVE  NEGATIVE   URINE MICROSCOPIC-ADD ON      Component Value Range   RBC / HPF 0-2  <3 (RBC/hpf)    Bacteria, UA RARE  RARE    Patient only got partial relief of pain with hydromorphone. She'll be given a dose of ketorolac and reevaluated. The patient and her mother were asking why CT scan had not been ordered. I explained to them that she is going to have multiple occasions were CT scans could be obtained and she is hard he had 3 scans of her abdomen and pelvis in our system in that today the inflammation (the skin is now worth the cancer risk. I have looked at her last ED visit and she was prescribed ciprofloxacin for possible UTI, but urine culture showed mixed flora suggesting contaminated specimen.  Patient complains that she is actually feeling worse after the ketorolac. I discussed her case with Dr. Jerre Simon who agrees to see her in consultation in his office in 3 days. At this point, it is clear that the pain is not related to urinary tract infection or renal calculi. She will be given a dose of Robaxin and reevaluated. Case will be endorsed to Dr. Doylene Canard.   1. Flank pain       MDM  Flank pain which seems most likely to be due to a urinary tract infection. Prior ED records and imaging results were reviewed. CT scan recently showed bilateral 2 mm renal calculi. With current symptoms being bilateral, and is very unlikely that this represents pain from the nephrolithiasis. I do not see any indication for repeat imaging today.        Dione Booze, MD 11/11/11 [phone]

## 2011-11-11 NOTE — ED Notes (Signed)
Pt complaining about her head hurting at this time. States it has been hurting for a couple of months. Ice pack given at pt request. EDP notified.

## 2011-11-11 NOTE — ED Notes (Signed)
Pt woke at 3 oclock w/ a stabbing pain on the left side. Pt took hydrocodone before coming to the er.

## 2011-11-12 LAB — URINE CULTURE

## 2011-11-26 ENCOUNTER — Encounter (HOSPITAL_COMMUNITY): Payer: Self-pay

## 2011-11-26 ENCOUNTER — Emergency Department (HOSPITAL_COMMUNITY): Payer: Managed Care, Other (non HMO)

## 2011-11-26 ENCOUNTER — Emergency Department (HOSPITAL_COMMUNITY)
Admission: EM | Admit: 2011-11-26 | Discharge: 2011-11-27 | Disposition: A | Discharge status: Home / Self Care | Payer: Managed Care, Other (non HMO) | Attending: Emergency Medicine | Admitting: Emergency Medicine

## 2011-11-26 DIAGNOSIS — M79609 Pain in unspecified limb: Secondary | ICD-10-CM | POA: Insufficient documentation

## 2011-11-26 DIAGNOSIS — M545 Low back pain, unspecified: Secondary | ICD-10-CM | POA: Insufficient documentation

## 2011-11-26 DIAGNOSIS — M25519 Pain in unspecified shoulder: Secondary | ICD-10-CM | POA: Insufficient documentation

## 2011-11-26 DIAGNOSIS — R109 Unspecified abdominal pain: Secondary | ICD-10-CM | POA: Insufficient documentation

## 2011-11-26 DIAGNOSIS — M549 Dorsalgia, unspecified: Secondary | ICD-10-CM | POA: Insufficient documentation

## 2011-11-26 DIAGNOSIS — R350 Frequency of micturition: Secondary | ICD-10-CM | POA: Insufficient documentation

## 2011-11-26 LAB — URINALYSIS, ROUTINE W REFLEX MICROSCOPIC
Bilirubin Urine: NEGATIVE
Glucose, UA: NEGATIVE mg/dL
Hgb urine dipstick: NEGATIVE
Ketones, ur: NEGATIVE mg/dL
Protein, ur: NEGATIVE mg/dL
Urobilinogen, UA: 0.2 mg/dL (ref 0.0–1.0)

## 2011-11-26 MED ORDER — ONDANSETRON 4 MG PO TBDP
4.0000 mg | ORAL_TABLET | Freq: Once | ORAL | Status: AC
  Administered 2011-11-26: 4 mg via ORAL
  Filled 2011-11-26: qty 1

## 2011-11-26 MED ORDER — KETOROLAC TROMETHAMINE 60 MG/2ML IM SOLN
60.0000 mg | Freq: Once | INTRAMUSCULAR | Status: AC
  Administered 2011-11-27: 60 mg via INTRAMUSCULAR
  Filled 2011-11-26: qty 2

## 2011-11-26 MED ORDER — HYDROMORPHONE HCL PF 1 MG/ML IJ SOLN
1.0000 mg | Freq: Once | INTRAMUSCULAR | Status: AC
  Administered 2011-11-27: 1 mg via INTRAMUSCULAR
  Filled 2011-11-26: qty 1

## 2011-11-26 NOTE — ED Notes (Signed)
Pt adds increased pressure to urinate.

## 2011-11-26 NOTE — ED Notes (Signed)
Pt presents with bilateral flank, leg, and shoulder pain that started today. Pt also states she feels weak. Pt states she took 2 flexeril earlier.

## 2011-11-26 NOTE — ED Provider Notes (Signed)
History     CSN: [id number]  Arrival date & time 11/26/11  2216   First MD Initiated Contact with Patient 11/26/11 2233      Chief Complaint  Patient presents with  . Flank Pain  . Shoulder Pain  . Leg Pain    (Consider location/radiation/quality/duration/timing/severity/associated sxs/prior treatment) HPI Comments: Hurting in lower back bilaterally.  This PM began having discomfort in B shoulders and having "shooting pain" in L leg.  Back pain worse with bending and movement.  Denies any known injuries.  No fever or chills.  No vaginal d/c.  LMP ~ 10 days ago.  S/p BTL.  States she has had 2 CT's this year.  Dr. Jerre Simon has told her that she a small stone in each kidney, "but they are not causing any harm and are not the cause of your pain".  She says, "he won't take them out".  Patient is a 26 y.o. female presenting with flank pain, shoulder pain, and leg pain. The history is provided by the patient. No language interpreter was used.  Flank Pain This is a new problem. Episode onset: this AM. The problem occurs constantly. The problem has been unchanged. Pertinent negatives include no chills or fever. The symptoms are aggravated by bending. Treatments tried: 2 flexeril and 2 aspirin. The treatment provided no relief.  Shoulder Pain Pertinent negatives include no chills or fever.  Leg Pain     Past Medical History  Diagnosis Date  . Renal disorder   . Hypertension     Past Surgical History  Procedure Date  . Tubal ligation   . Stone extraction with basket     No family history on file.  History  Substance Use Topics  . Smoking status: Never Smoker   . Smokeless tobacco: Not on file  . Alcohol Use: No    OB History    Grav Para Term Preterm Abortions TAB SAB Ect Mult Living                  Review of Systems  Constitutional: Negative for fever and chills.  Genitourinary: Positive for urgency and frequency. Negative for hematuria and flank pain.    Musculoskeletal: Positive for back pain.  All other systems reviewed and are negative.    Allergies  Vicodin  Home Medications   Current Outpatient Rx  Name Route Sig Dispense Refill  . HYDROCODONE-ACETAMINOPHEN 5-325 MG PO TABS Oral Take 1 tablet by mouth every 6 (six) hours as needed.    . IBUPROFEN 200 MG PO TABS Oral Take 800 mg by mouth every 6 (six) hours as needed. pain    . METHOCARBAMOL 750 MG PO TABS  1-2 tabs po QID for low back pain 40 tablet 0  . ADULT MULTIVITAMIN W/MINERALS CH Oral Take 1 tablet by mouth daily.    Marland Kitchen POTASSIUM CITRATE ER 10 MEQ (1080 MG) PO TBCR Oral Take 10 mEq by mouth daily.    Marland Kitchen PROMETHAZINE HCL 25 MG PO TABS Oral Take 1 tablet (25 mg total) by mouth every 6 (six) hours as needed for nausea. 15 tablet 0    BP 130/85  Pulse 86  Temp(Src) 98.3 F (36.8 C) (Oral)  Resp 18  Ht 5\' 3"  (1.6 m)  Wt 219 lb (99.338 kg)  BMI 38.79 kg/m2  SpO2 100%  LMP 11/20/2011  Physical Exam  Nursing note and vitals reviewed. Constitutional: She is oriented to person, place, and time. She appears well-developed and well-nourished. No distress.  HENT:  Head: Normocephalic and atraumatic.  Eyes: EOM are normal.  Neck: Normal range of motion.  Cardiovascular: Normal rate, regular rhythm and normal heart sounds.   Pulmonary/Chest: Effort normal and breath sounds normal.  Abdominal: Soft. She exhibits no distension. There is no tenderness.  Musculoskeletal: She exhibits tenderness.       Lumbar back: She exhibits decreased range of motion and tenderness. She exhibits no bony tenderness.       Back:       Pain and PT in muscular areas of B shoulders.  Pain in L leg she states is worse with weight bearing.  No radicular sxs.  No known DDD.  Neurological: She is alert and oriented to person, place, and time.  Skin: Skin is warm and dry.  Psychiatric: She has a normal mood and affect. Judgment normal.    ED Course  Procedures (including critical care  time) Reviewed abd/pelvis CT finding in jan 2013 and march 2013.  1 small renal calculi in each kidney.  Labs Reviewed  URINALYSIS, ROUTINE W REFLEX MICROSCOPIC  PREGNANCY, URINE   Dg Abd 1 View  11/27/2011  *RADIOLOGY REPORT*  Clinical Data: Bilateral flank pain.  Low back pain.  ABDOMEN - 1 VIEW  Comparison: CT abdomen and pelvis 09/17/2011 and plain film the abdomen 11/11/2011.  Findings: Large volume of stool in the ascending colon is noted. Bowel gas pattern is otherwise unremarkable.  Punctate nonobstructing stone in the left kidney seen on the prior CT is not visible on this study.  No evidence of ureteral stone is seen. Transitional anatomy at the lumbosacral junction is noted.  IMPRESSION: Large volume of stool ascending colon.  Otherwise negative.  Original Report Authenticated By: Bernadene Bell. D'ALESSIO, M.D.     1. Low back pain       MDM  Exam c/w lumbar strain.  No evid of UTI or kidney stones.  Told to f/u with dr's javaid and keeling. rx-robaxin, [address] [place], [place] 11/27/11 [phone]

## 2011-11-27 MED ORDER — METHOCARBAMOL 750 MG PO TABS: ORAL_TABLET | ORAL | Status: DC

## 2011-11-27 NOTE — Discharge Instructions (Signed)
Cryotherapy Cryotherapy means treatment with cold. Ice or gel packs can be used to reduce both pain and swelling. Ice is the most helpful within the first 24 to 48 hours after an injury or flareup from overusing a muscle or joint. Sprains, strains, spasms, burning pain, shooting pain, and aches can all be eased with ice. Ice can also be used when recovering from surgery. Ice is effective, has very few side effects, and is safe for most people to use. PRECAUTIONS  Ice is not a safe treatment option for people with:  Raynaud's phenomenon. This is a condition affecting small blood vessels in the extremities. Exposure to cold may cause your problems to return.   Cold hypersensitivity. There are many forms of cold hypersensitivity, including:   Cold urticaria. Red, itchy hives appear on the skin when the tissues begin to warm after being iced.   Cold erythema. This is a red, itchy rash caused by exposure to cold.   Cold hemoglobinuria. Red blood cells break down when the tissues begin to warm after being iced. The hemoglobin that carry oxygen are passed into the urine because they cannot combine with blood proteins fast enough.   Numbness or altered sensitivity in the area being iced.  If you have any of the following conditions, do not use ice until you have discussed cryotherapy with your caregiver:  Heart conditions, such as arrhythmia, angina, or chronic heart disease.   High blood pressure.   Healing wounds or open skin in the area being iced.   Current infections.   Rheumatoid arthritis.   Poor circulation.   Diabetes.  Ice slows the blood flow in the region it is applied. This is beneficial when trying to stop inflamed tissues from spreading irritating chemicals to surrounding tissues. However, if you expose your skin to cold temperatures for too long or without the proper protection, you can damage your skin or nerves. Watch for signs of skin damage due to cold. HOME CARE  INSTRUCTIONS Follow these tips to use ice and cold packs safely.  Place a dry or damp towel between the ice and skin. A damp towel will cool the skin more quickly, so you may need to shorten the time that the ice is used.   For a more rapid response, add gentle compression to the ice.   Ice for no more than 10 to 20 minutes at a time. The bonier the area you are icing, the less time it will take to get the benefits of ice.   Check your skin after 5 minutes to make sure there are no signs of a poor response to cold or skin damage.   Rest 20 minutes or more in between uses.   Once your skin is numb, you can end your treatment. You can test numbness by very lightly touching your skin. The touch should be so light that you do not see the skin dimple from the pressure of your fingertip. When using ice, most people will feel these normal sensations in this order: cold, burning, aching, and numbness.   Do not use ice on someone who cannot communicate their responses to pain, such as small children or people with dementia.  HOW TO MAKE AN ICE PACK Ice packs are the most common way to use ice therapy. Other methods include ice massage, ice baths, and cryo-sprays. Muscle creams that cause a cold, tingly feeling do not offer the same benefits that ice offers and should not be used as a substitute  unless recommended by your caregiver. To make an ice pack, do one of the following:  Place crushed ice or a bag of frozen vegetables in a sealable plastic bag. Squeeze out the excess air. Place this bag inside another plastic bag. Slide the bag into a pillowcase or place a damp towel between your skin and the bag.   Mix 3 parts water with 1 part rubbing alcohol. Freeze the mixture in a sealable plastic bag. When you remove the mixture from the freezer, it will be slushy. Squeeze out the excess air. Place this bag inside another plastic bag. Slide the bag into a pillowcase or place a damp towel between your skin  and the bag.  SEEK MEDICAL CARE IF:  You develop white spots on your skin. This may give the skin a blotchy (mottled) appearance.   Your skin turns blue or pale.   Your skin becomes waxy or hard.   Your swelling gets worse.  MAKE SURE YOU:   Understand these instructions.   Will watch your condition.   Will get help right away if you are not doing well or get worse.  Document Released: 02/28/2011 Document Revised: 06/23/2011 Document Reviewed: 02/28/2011 [facility] Patient Information 2012 [place], [place].Back Pain, Adult Low back pain is very common. About 1 in 5 people have back pain.The cause of low back pain is rarely dangerous. The pain often gets better over time.About half of people with a sudden onset of back pain feel better in just 2 weeks. About 8 in 10 people feel better by 6 weeks.  CAUSES Some common causes of back pain include:  Strain of the muscles or ligaments supporting the spine.   Wear and tear (degeneration) of the spinal discs.   Arthritis.   Direct injury to the back.  DIAGNOSIS Most of the time, the direct cause of low back pain is not known.However, back pain can be treated effectively even when the exact cause of the pain is unknown.Answering your caregiver's questions about your overall health and symptoms is one of the most accurate ways to make sure the cause of your pain is not dangerous. If your caregiver needs more information, he or she may order lab work or imaging tests (X-rays or MRIs).However, even if imaging tests show changes in your back, this usually does not require surgery. HOME CARE INSTRUCTIONS For many people, back pain returns.Since low back pain is rarely dangerous, it is often a condition that people can learn to [facility] their own.   Remain active. It is stressful on the back to sit or stand in one place. Do not sit, drive, or stand in one place for more than 30 minutes at a time. Take short walks on level surfaces as soon as  pain allows.Try to increase the length of time you walk each day.   Do not stay in bed.Resting more than 1 or 2 days can delay your recovery.   Do not avoid exercise or work.Your body is made to move.It is not dangerous to be active, even though your back may hurt.Your back will likely heal faster if you return to being active before your pain is gone.   Pay attention to your body when you bend and lift. Many people have less discomfortwhen lifting if they bend their knees, keep the load close to their bodies,and avoid twisting. Often, the most comfortable positions are those that put less stress on your recovering back.   Find a comfortable position to sleep. Use a firm  mattress and lie on your side with your knees slightly bent. If you lie on your back, put a pillow under your knees.   Only take over-the-counter or prescription medicines as directed by your caregiver. Over-the-counter medicines to reduce pain and inflammation are often the most helpful.Your caregiver may prescribe muscle relaxant drugs.These medicines help dull your pain so you can more quickly return to your normal activities and healthy exercise.   Put ice on the injured area.   Put ice in a plastic bag.   Place a towel between your skin and the bag.   Leave the ice on for 15 to 20 minutes, 3 to 4 times a day for the first 2 to 3 days. After that, ice and heat may be alternated to reduce pain and spasms.   Ask your caregiver about trying back exercises and gentle massage. This may be of some benefit.   Avoid feeling anxious or stressed.Stress increases muscle tension and can worsen back pain.It is important to recognize when you are anxious or stressed and learn ways to manage it.Exercise is a great option.  SEEK MEDICAL CARE IF:  You have pain that is not relieved with rest or medicine.   You have pain that does not improve in 1 week.   You have new symptoms.   You are generally not feeling well.    SEEK IMMEDIATE MEDICAL CARE IF:   You have pain that radiates from your back into your legs.   You develop new bowel or bladder control problems.   You have unusual weakness or numbness in your arms or legs.   You develop nausea or vomiting.   You develop abdominal pain.   You feel faint.  Document Released: 07/04/2005 Document Revised: 06/23/2011 Document Reviewed: 11/22/2010 [facility] Patient Information 2012 [place], [place].   The urinalysis and x-ray are normal.  Take the robaxin as directed.  Call dr. Jerre Simon for further instruction.  Call dr. Hilda Lias for an appt if your lower back continues to hurt.

## 2011-11-27 NOTE — ED Provider Notes (Signed)
Medical screening examination/treatment/procedure(s) were performed by non-physician practitioner and as supervising physician I was immediately available for consultation/collaboration.  Leeanne Butters, MD 11/27/11 2352 

## 2011-12-17 ENCOUNTER — Encounter (HOSPITAL_COMMUNITY): Payer: Self-pay | Admitting: *Deleted

## 2011-12-17 ENCOUNTER — Emergency Department (HOSPITAL_COMMUNITY)
Admission: EM | Admit: 2011-12-17 | Discharge: 2011-12-17 | Disposition: A | Discharge status: Home / Self Care | Payer: Managed Care, Other (non HMO) | Attending: Emergency Medicine | Admitting: Emergency Medicine

## 2011-12-17 DIAGNOSIS — N289 Disorder of kidney and ureter, unspecified: Secondary | ICD-10-CM | POA: Insufficient documentation

## 2011-12-17 DIAGNOSIS — M549 Dorsalgia, unspecified: Secondary | ICD-10-CM | POA: Insufficient documentation

## 2011-12-17 DIAGNOSIS — I10 Essential (primary) hypertension: Secondary | ICD-10-CM | POA: Insufficient documentation

## 2011-12-17 DIAGNOSIS — Z79899 Other long term (current) drug therapy: Secondary | ICD-10-CM | POA: Insufficient documentation

## 2011-12-17 LAB — URINALYSIS, ROUTINE W REFLEX MICROSCOPIC
Bilirubin Urine: NEGATIVE
Glucose, UA: NEGATIVE mg/dL
Nitrite: NEGATIVE
Specific Gravity, Urine: 1.01 (ref 1.005–1.030)
pH: 6.5 (ref 5.0–8.0)

## 2011-12-17 LAB — URINE MICROSCOPIC-ADD ON

## 2011-12-17 MED ORDER — HYDROMORPHONE HCL PF 1 MG/ML IJ SOLN
1.0000 mg | Freq: Once | INTRAMUSCULAR | Status: AC
  Administered 2011-12-17: 1 mg via INTRAMUSCULAR
  Filled 2011-12-17: qty 1

## 2011-12-17 MED ORDER — METHYLPREDNISOLONE SODIUM SUCC 125 MG IJ SOLR
125.0000 mg | Freq: Once | INTRAMUSCULAR | Status: AC
  Administered 2011-12-17: 125 mg via INTRAMUSCULAR
  Filled 2011-12-17: qty 2

## 2011-12-17 MED ORDER — PREDNISONE 10 MG PO TABS: 20.0000 mg | ORAL_TABLET | Freq: Every day | ORAL | Status: DC

## 2011-12-17 NOTE — ED Notes (Signed)
Called once for triage, no response 

## 2011-12-17 NOTE — ED Notes (Signed)
Pt reports pain in lower back, states that she sometimes has pain at base of neck as well.  Pt reports she does see Dr. Hilda Lias, next appointment is end of next week.  Reports medication prescribed hasn't been effective.

## 2011-12-17 NOTE — ED Provider Notes (Signed)
History     CSN: [id number]  Arrival date & time 12/17/11  3086   First MD Initiated Contact with Patient 12/17/11 0340      Chief Complaint  Patient presents with  . Back Pain    (Consider location/radiation/quality/duration/timing/severity/associated sxs/prior treatment) Patient is a 26 y.o. female presenting with back pain. The history is provided by the patient (pt complains of back pain).  Back Pain  This is a recurrent problem. The current episode started 2 days ago. The problem occurs constantly. The problem has not changed since onset.The pain is associated with falling. The pain is present in the lumbar spine. The quality of the pain is described as stabbing. The pain radiates to the left thigh. The pain is at a severity of 5/10. Stiffness is present all day. Pertinent negatives include no chest pain, no fever, no headaches and no abdominal pain. She has tried NSAIDs for the symptoms. The treatment provided moderate relief.    Past Medical History  Diagnosis Date  . Renal disorder   . Hypertension     Past Surgical History  Procedure Date  . Tubal ligation   . Stone extraction with basket     History reviewed. No pertinent family history.  History  Substance Use Topics  . Smoking status: Never Smoker   . Smokeless tobacco: Not on file  . Alcohol Use: No    OB History    Grav Para Term Preterm Abortions TAB SAB Ect Mult Living                  Review of Systems  Constitutional: Negative for fever and fatigue.  HENT: Negative for congestion, sinus pressure and ear discharge.   Eyes: Negative for discharge.  Respiratory: Negative for cough.   Cardiovascular: Negative for chest pain.  Gastrointestinal: Negative for abdominal pain and diarrhea.  Genitourinary: Negative for frequency and hematuria.  Musculoskeletal: Positive for back pain.  Skin: Negative for rash.  Neurological: Negative for seizures and headaches.  Hematological: Negative.     Psychiatric/Behavioral: Negative for hallucinations.    Allergies  Vicodin  Home Medications   Current Outpatient Rx  Name Route Sig Dispense Refill  . CYCLOBENZAPRINE HCL 10 MG PO TABS Oral Take 10 mg by mouth 3 (three) times daily as needed.    Marland Kitchen HYDROCODONE-ACETAMINOPHEN 5-325 MG PO TABS Oral Take 1 tablet by mouth every 6 (six) hours as needed.    . IBUPROFEN 200 MG PO TABS Oral Take 800 mg by mouth every 6 (six) hours as needed. pain    . METHOCARBAMOL 750 MG PO TABS  1-2 tabs po QID for low back pain 40 tablet 0  . ADULT MULTIVITAMIN W/MINERALS CH Oral Take 1 tablet by mouth daily.    Marland Kitchen POTASSIUM CITRATE ER 10 MEQ (1080 MG) PO TBCR Oral Take 10 mEq by mouth daily.    Marland Kitchen PREDNISONE 10 MG PO TABS Oral Take 2 tablets (20 mg total) by mouth daily. 15 tablet 0  . PROMETHAZINE HCL 25 MG PO TABS Oral Take 1 tablet (25 mg total) by mouth every 6 (six) hours as needed for nausea. 15 tablet 0    BP 132/86  Pulse 89  Temp(Src) 98 F (36.7 C) (Oral)  Resp 20  Ht 5\' 3"  (1.6 m)  SpO2 97%  LMP 12/16/2011  Physical Exam  Constitutional: She is oriented to person, place, and time. She appears well-developed.  HENT:  Head: Normocephalic and atraumatic.  Eyes: Conjunctivae and EOM are  normal. No scleral icterus.  Neck: Neck supple. No thyromegaly present.  Cardiovascular: Normal rate and regular rhythm.  Exam reveals no gallop and no friction rub.   No murmur heard. Pulmonary/Chest: No stridor. She has no wheezes. She has no rales. She exhibits no tenderness.  Abdominal: She exhibits no distension. There is no tenderness. There is no rebound.  Musculoskeletal: Normal range of motion. She exhibits tenderness.       Tender lumbar spine.  Pos straight leg raise left  Lymphadenopathy:    She has no cervical adenopathy.  Neurological: She is oriented to person, place, and time. She displays normal reflexes. She exhibits normal muscle tone. Coordination normal.  Skin: No rash noted. No  erythema.  Psychiatric: She has a normal mood and affect. Her behavior is normal.    ED Course  Procedures (including critical care time)  Labs Reviewed - No data to display No results found.   1. Back pain       MDM          Benny Lennert, MD 12/17/11 [phone]

## 2011-12-17 NOTE — ED Notes (Signed)
Pt instructed not to drive this am due to pain medication

## 2011-12-17 NOTE — Discharge Instructions (Signed)
Follow up with your md as planned °

## 2011-12-17 NOTE — ED Notes (Addendum)
C/O lower back pain,chronic, that increased in intensity today after lifting an heavy object; pt also reports pain in neck area; pain described as aching with spasms from one side of the back  to the other side

## 2012-01-12 ENCOUNTER — Other Ambulatory Visit (HOSPITAL_COMMUNITY): Payer: Self-pay | Admitting: Orthopaedic Surgery

## 2012-01-12 DIAGNOSIS — M545 Low back pain: Secondary | ICD-10-CM

## 2012-01-13 ENCOUNTER — Ambulatory Visit (HOSPITAL_COMMUNITY)
Admission: RE | Admit: 2012-01-13 | Discharge: 2012-01-13 | Disposition: A | Discharge status: Home / Self Care | Payer: Managed Care, Other (non HMO) | Source: Ambulatory Visit | Attending: Orthopaedic Surgery | Admitting: Orthopaedic Surgery

## 2012-01-13 DIAGNOSIS — M545 Low back pain, unspecified: Secondary | ICD-10-CM | POA: Insufficient documentation

## 2012-02-14 ENCOUNTER — Encounter (HOSPITAL_COMMUNITY): Payer: Self-pay | Admitting: Emergency Medicine

## 2012-02-14 ENCOUNTER — Emergency Department (HOSPITAL_COMMUNITY)
Admission: EM | Admit: 2012-02-14 | Discharge: 2012-02-15 | Disposition: A | Discharge status: Home / Self Care | Payer: Managed Care, Other (non HMO) | Attending: Emergency Medicine | Admitting: Emergency Medicine

## 2012-02-14 DIAGNOSIS — S239XXA Sprain of unspecified parts of thorax, initial encounter: Secondary | ICD-10-CM | POA: Insufficient documentation

## 2012-02-14 DIAGNOSIS — X500XXA Overexertion from strenuous movement or load, initial encounter: Secondary | ICD-10-CM | POA: Insufficient documentation

## 2012-02-14 DIAGNOSIS — N289 Disorder of kidney and ureter, unspecified: Secondary | ICD-10-CM | POA: Insufficient documentation

## 2012-02-14 DIAGNOSIS — S29012A Strain of muscle and tendon of back wall of thorax, initial encounter: Secondary | ICD-10-CM

## 2012-02-14 DIAGNOSIS — I10 Essential (primary) hypertension: Secondary | ICD-10-CM | POA: Insufficient documentation

## 2012-02-14 DIAGNOSIS — Y990 Civilian activity done for income or pay: Secondary | ICD-10-CM | POA: Insufficient documentation

## 2012-02-14 DIAGNOSIS — Y9289 Other specified places as the place of occurrence of the external cause: Secondary | ICD-10-CM | POA: Insufficient documentation

## 2012-02-14 DIAGNOSIS — Y9389 Activity, other specified: Secondary | ICD-10-CM | POA: Insufficient documentation

## 2012-02-14 HISTORY — DX: Dorsalgia, unspecified: M54.9

## 2012-02-14 NOTE — ED Notes (Signed)
Patient complaining of back pain after moving furniture at work. Patient has history of back pain.

## 2012-02-15 ENCOUNTER — Emergency Department (HOSPITAL_COMMUNITY): Payer: Managed Care, Other (non HMO)

## 2012-02-15 LAB — URINE MICROSCOPIC-ADD ON

## 2012-02-15 LAB — URINALYSIS, ROUTINE W REFLEX MICROSCOPIC
Nitrite: NEGATIVE
Protein, ur: NEGATIVE mg/dL
Urobilinogen, UA: 0.2 mg/dL (ref 0.0–1.0)

## 2012-02-15 MED ORDER — HYDROMORPHONE HCL PF 1 MG/ML IJ SOLN
1.0000 mg | Freq: Once | INTRAMUSCULAR | Status: AC
  Administered 2012-02-15: 1 mg via INTRAMUSCULAR
  Filled 2012-02-15: qty 1

## 2012-02-15 MED ORDER — DIPHENHYDRAMINE HCL 25 MG PO CAPS
25.0000 mg | ORAL_CAPSULE | Freq: Once | ORAL | Status: AC
  Administered 2012-02-15: 25 mg via ORAL
  Filled 2012-02-15: qty 1

## 2012-02-15 MED ORDER — KETOROLAC TROMETHAMINE 60 MG/2ML IM SOLN
60.0000 mg | Freq: Once | INTRAMUSCULAR | Status: AC
  Administered 2012-02-15: 60 mg via INTRAMUSCULAR
  Filled 2012-02-15: qty 2

## 2012-02-15 MED ORDER — ONDANSETRON 4 MG PO TBDP
4.0000 mg | ORAL_TABLET | Freq: Once | ORAL | Status: AC
  Administered 2012-02-15: 4 mg via ORAL
  Filled 2012-02-15: qty 1

## 2012-02-15 NOTE — ED Notes (Signed)
Requesting a heating pad or an ice pack  States she cannot get comfortable.  Ambulatory in room.  Talking with family member

## 2012-02-15 NOTE — ED Provider Notes (Signed)
History     CSN: [id number]  Arrival date & time 02/14/12  2339   First MD Initiated Contact with Patient 02/14/12 2350      Chief Complaint  Patient presents with  . Back Pain    (Consider location/radiation/quality/duration/timing/severity/associated sxs/prior treatment) HPI Comments: Pt injured thoracic area of back today pushing and pulling furniture at work.  States she has seen dr. Hilda Lias for LS back pain before.  She has also had urinary frequency, dysuria and urgency with lower abdominal and low back pain for the past 4-5 days.  No fever or chills.  Doe have h/o kidney stones.  Patient is a 26 y.o. female presenting with back pain. The history is provided by the patient. No language interpreter was used.  Back Pain  This is a new problem. The pain is present in the thoracic spine. The pain does not radiate. The pain is severe. The symptoms are aggravated by bending and twisting. Associated symptoms include dysuria. Pertinent negatives include no fever, no numbness and no weakness. She has tried muscle relaxants (took flexeril and percocet) for the symptoms. The treatment provided no relief. Risk factors include obesity.    Past Medical History  Diagnosis Date  . Renal disorder   . Hypertension   . Back pain     Past Surgical History  Procedure Date  . Tubal ligation   . Stone extraction with basket     History reviewed. No pertinent family history.  History  Substance Use Topics  . Smoking status: Never Smoker   . Smokeless tobacco: Not on file  . Alcohol Use: No    OB History    Grav Para Term Preterm Abortions TAB SAB Ect Mult Living                  Review of Systems  Constitutional: Negative for fever and chills.  Genitourinary: Positive for dysuria, urgency and frequency. Negative for hematuria.  Musculoskeletal: Positive for back pain.  Neurological: Negative for weakness and numbness.  All other systems reviewed and are negative.    Allergies   Vicodin  Home Medications   Current Outpatient Rx  Name Route Sig Dispense Refill  . CYCLOBENZAPRINE HCL 10 MG PO TABS Oral Take 10 mg by mouth 3 (three) times daily as needed.    Marland Kitchen HYDROCODONE-ACETAMINOPHEN 5-325 MG PO TABS Oral Take 1 tablet by mouth every 6 (six) hours as needed.    . IBUPROFEN 200 MG PO TABS Oral Take 800 mg by mouth every 6 (six) hours as needed. pain    . METHOCARBAMOL 750 MG PO TABS  1-2 tabs po QID for low back pain 40 tablet 0  . ADULT MULTIVITAMIN W/MINERALS CH Oral Take 1 tablet by mouth daily.    Marland Kitchen POTASSIUM CITRATE ER 10 MEQ (1080 MG) PO TBCR Oral Take 10 mEq by mouth daily.    Marland Kitchen PREDNISONE 10 MG PO TABS Oral Take 2 tablets (20 mg total) by mouth daily. 15 tablet 0  . PROMETHAZINE HCL 25 MG PO TABS Oral Take 1 tablet (25 mg total) by mouth every 6 (six) hours as needed for nausea. 15 tablet 0    BP 149/99  Pulse 99  Temp 98 F (36.7 C) (Oral)  Resp 16  Ht 5\' 2"  (1.575 m)  Wt 215 lb (97.523 kg)  BMI 39.32 kg/m2  SpO2 98%  LMP 02/08/2012  Physical Exam  Nursing note and vitals reviewed. Constitutional: She is oriented to person, place, and time.  She appears well-developed and well-nourished. No distress.  HENT:  Head: Normocephalic and atraumatic.  Eyes: EOM are normal.  Neck: Normal range of motion.  Cardiovascular: Normal rate, regular rhythm and normal heart sounds.   Pulmonary/Chest: Effort normal and breath sounds normal.  Abdominal: Soft. She exhibits no distension. There is tenderness in the suprapubic area. There is no rigidity, no rebound, no guarding and no CVA tenderness.    Musculoskeletal:       Thoracic back: She exhibits decreased range of motion, tenderness and pain. She exhibits no bony tenderness, no swelling, no edema, no deformity, no laceration, no spasm and normal pulse.       Back:  Neurological: She is alert and oriented to person, place, and time. She displays normal reflexes.  Skin: Skin is warm and dry.    Psychiatric: She has a normal mood and affect. Judgment normal.    ED Course  Procedures (including critical care time)  Labs Reviewed  URINALYSIS, ROUTINE W REFLEX MICROSCOPIC - Abnormal; Notable for the following:    Hgb urine dipstick SMALL (*)     All other components within normal limits  URINE MICROSCOPIC-ADD ON - Abnormal; Notable for the following:    Squamous Epithelial / LPF FEW (*)     Bacteria, UA MANY (*)     All other components within normal limits   No results found.   1. Strain of thoracic paraspinal muscles excluding T1 and T2 levels       MDM  Take your flexeril and percocet as directed.  Ice. F/u with dr. Hilda Lias prn.        Evalina Field, [place] 02/15/12 0120

## 2012-02-15 NOTE — ED Notes (Signed)
C/o itching now from medication - requesting something for "itching"

## 2012-02-15 NOTE — ED Provider Notes (Signed)
0200 Patient here with back pain,  ready for discharge when began to c/o abdominal pain. Pain across entire abdomen, sharp and stabbing. Last BM in the afternoon and normal. Bowel sounds normal. No tenderness to palpation. Ordered KUB. 0215 Patient c/o itching. She had been given dilaudid for pain earlier. Benadryl ordered.  KUB with stool and gas. Gave copy of films to patient. Proceeded with discharge. Dg Abd 1 View  02/15/2012  *RADIOLOGY REPORT*  Clinical Data: Abdominal pain  ABDOMEN - 1 VIEW  Comparison: 01/13/2012 L-spine MRI, 11/27/2011 radiograph  Findings: The bowel gas pattern is non-obstructive. Organ outlines are normal where seen. No acute or aggressive osseous abnormality identified.  Moderate stool burden.  IMPRESSION: Nonobstructive bowel gas pattern.  Moderate stool burden.  Original Report Authenticated By: Waneta Martins, M.D.    Nicoletta Dress. Colon Branch, MD 02/15/12 [phone]

## 2012-02-15 NOTE — ED Notes (Signed)
Patient sitting in bedside chair in room.  States the bed increases her back pain.

## 2012-02-20 NOTE — ED Provider Notes (Signed)
Medical screening examination/treatment/procedure(s) were performed by non-physician practitioner and as supervising physician I was immediately available for consultation/collaboration.  Nicoletta Dress. Colon Branch, MD 02/20/12 1610

## 2012-03-26 IMAGING — CT CT ABD-PELV W/O CM
2 of 3 series · 9 of 46 positions shown, 11 images · non-contrast
Comparison: 08/04/2011

CLINICAL DATA: Evaluate for kidney stone

CT ABDOMEN AND PELVIS WITHOUT CONTRAST
TECHNIQUE: Multidetector CT imaging of the abdomen and pelvis was
performed following the standard protocol without intravenous
contrast.

[Series 4: mpr coronal (id) · coronal · 0.74mm/px · 8 of 102 slices shown, 9 images]
[im 12/102  soft-tissue]
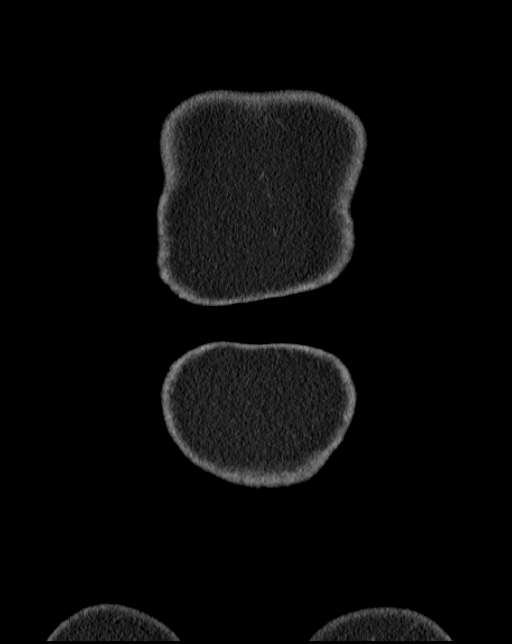
[im 12/102  bone]
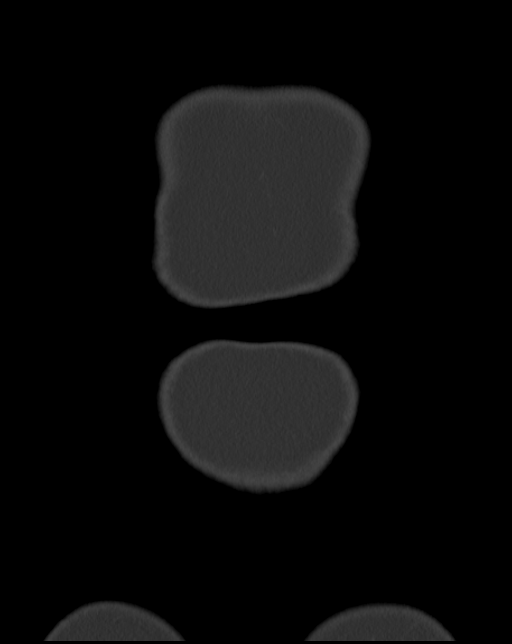
[im 23/102  soft-tissue]
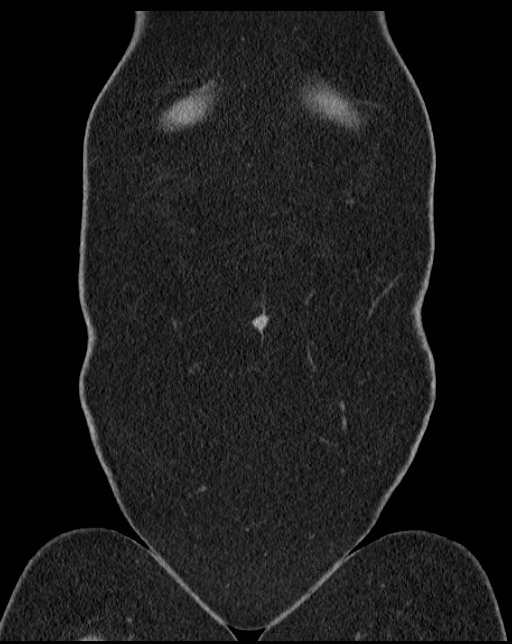
[im 34/102  soft-tissue]
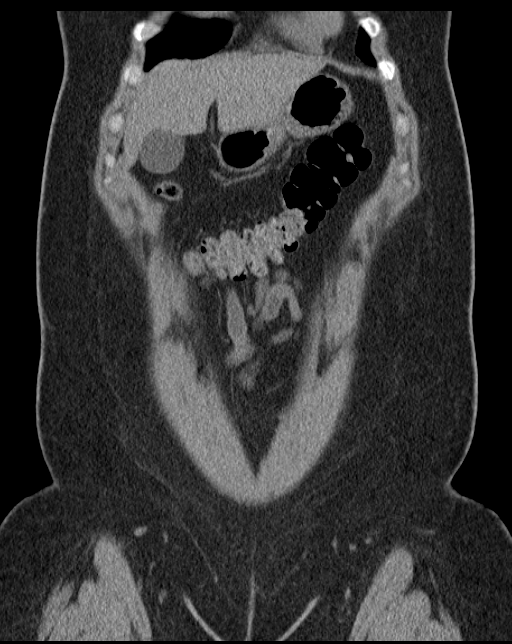
[im 45/102  soft-tissue]
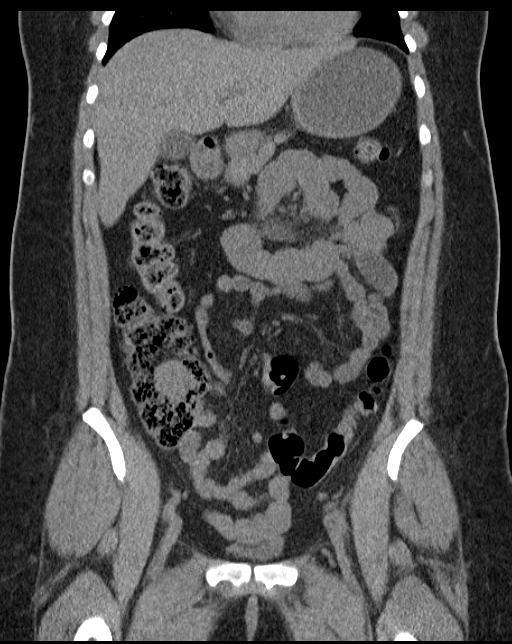
[im 57/102  soft-tissue]
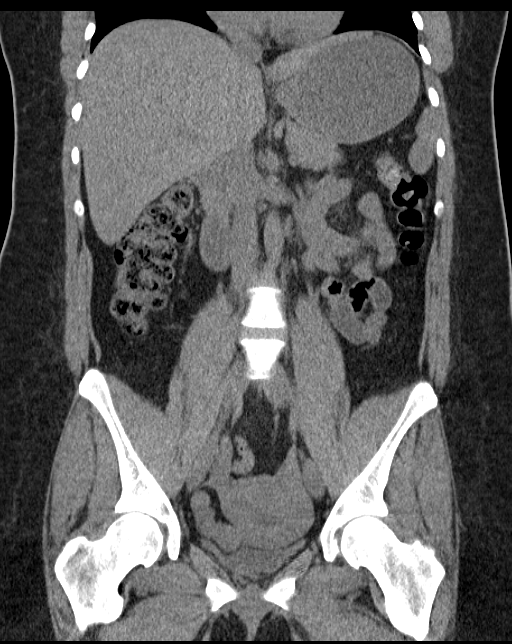
[im 68/102  soft-tissue]
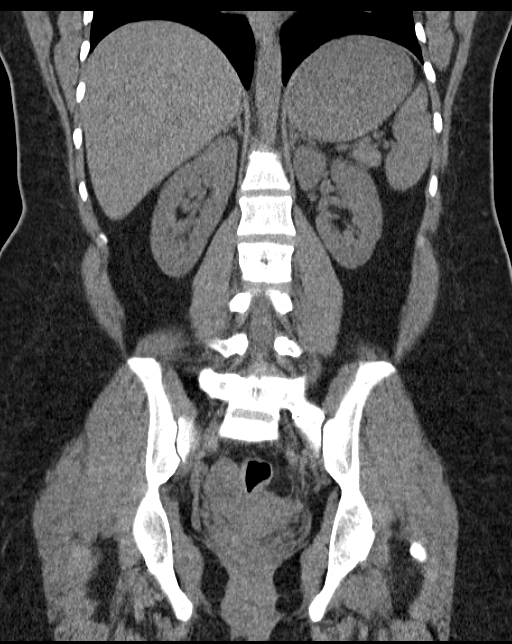
[im 79/102  soft-tissue]
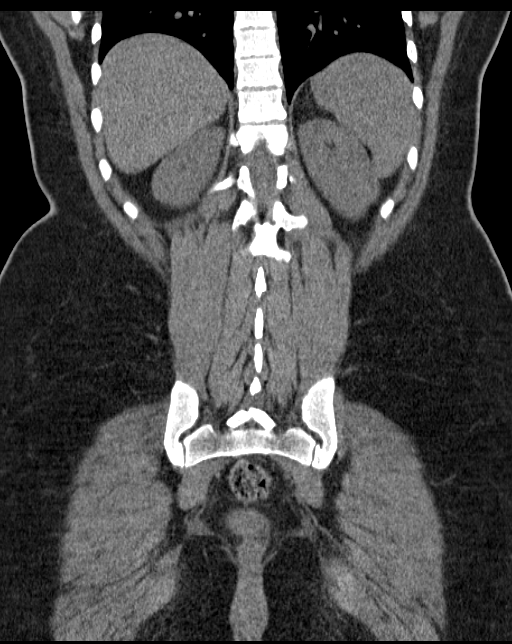
[im 90/102  soft-tissue]
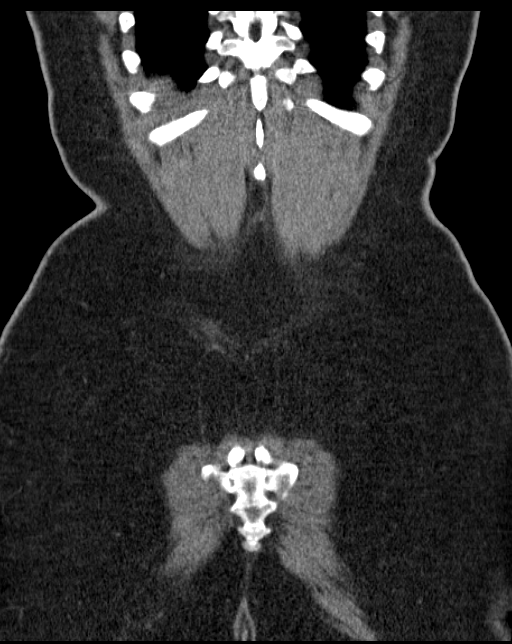

[Series 5: mpr sagittal (id) · sagittal · 0.66mm/px · 1 of 133 slices shown, 2 images]
[im 45/133  soft-tissue]
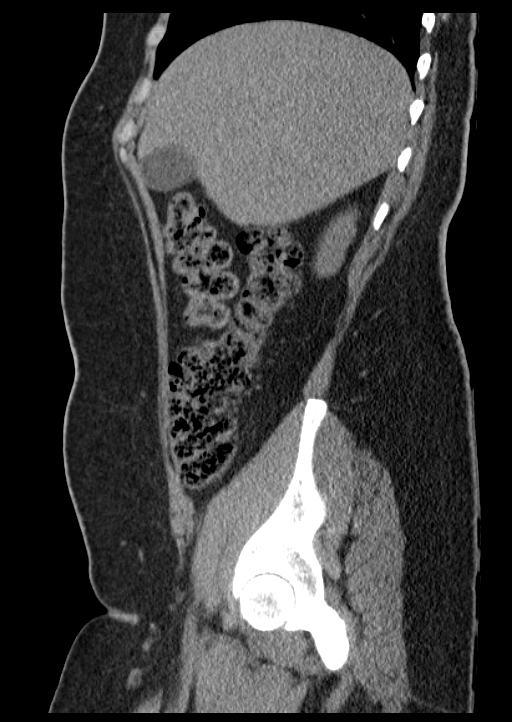
[im 45/133  bone]
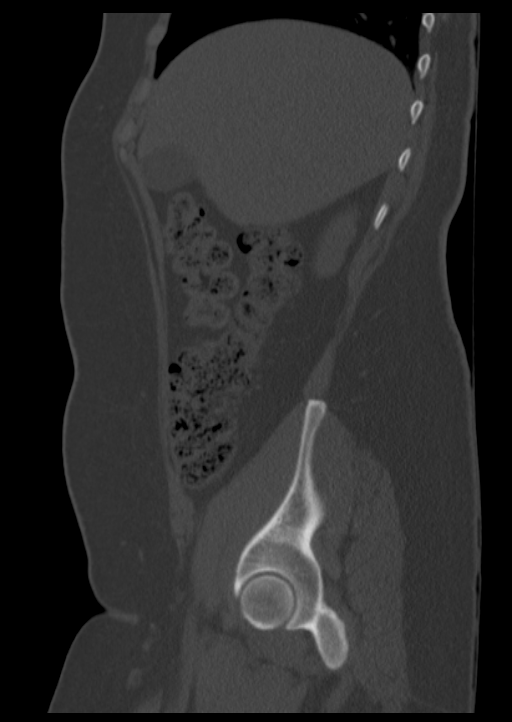

[9 of 46 positions shown; findings below may reference images not displayed]

FINDINGS: Lung bases are clear.

No pericardial or pleural effusion.

No focal liver abnormality.

The spleen appears normal.

The adrenal glands are both normal.

Gallbladder negative.

No biliary dilatation.

The pancreas is normal. Tiny nonobstructing calculus is noted
within the upper pole the right kidney measuring approximately 2
mm.

Small nonobstructing stone within the inferior pole of the left
kidney measures 2 mm.  No hydronephrosis or hydroureter noted.

No ureterolithiasis identified.

Urinary bladder appears normal.

The small bowel loops are negative.

The appendix is identified and appears normal.

Colon is negative.

Review of the visualized osseous structures is unremarkable.
IMPRESSION: 1.  Bilateral nonobstructing renal calculi measure up to 2 mm.
2.  No ureterolithiasis or bladder calculi noted.

## 2012-06-21 ENCOUNTER — Other Ambulatory Visit (HOSPITAL_COMMUNITY): Payer: Self-pay | Admitting: Orthopaedic Surgery

## 2012-06-21 DIAGNOSIS — M545 Low back pain: Secondary | ICD-10-CM

## 2012-06-28 ENCOUNTER — Ambulatory Visit (HOSPITAL_COMMUNITY)
Admission: RE | Admit: 2012-06-28 | Discharge: 2012-06-28 | Disposition: A | Discharge status: Home / Self Care | Payer: Managed Care, Other (non HMO) | Source: Ambulatory Visit | Attending: Orthopaedic Surgery | Admitting: Orthopaedic Surgery

## 2012-06-28 DIAGNOSIS — M545 Low back pain, unspecified: Secondary | ICD-10-CM | POA: Insufficient documentation

## 2012-06-28 DIAGNOSIS — M79609 Pain in unspecified limb: Secondary | ICD-10-CM | POA: Insufficient documentation

## 2014-10-15 ENCOUNTER — Emergency Department (HOSPITAL_COMMUNITY): Payer: BLUE CROSS/BLUE SHIELD

## 2014-10-15 ENCOUNTER — Emergency Department (HOSPITAL_COMMUNITY)
Admission: EM | Admit: 2014-10-15 | Discharge: 2014-10-15 | Disposition: A | Discharge status: Home / Self Care | Payer: BLUE CROSS/BLUE SHIELD | Attending: Emergency Medicine | Admitting: Emergency Medicine

## 2014-10-15 ENCOUNTER — Encounter (HOSPITAL_COMMUNITY): Payer: Self-pay | Admitting: Emergency Medicine

## 2014-10-15 DIAGNOSIS — Z3202 Encounter for pregnancy test, result negative: Secondary | ICD-10-CM | POA: Diagnosis not present

## 2014-10-15 DIAGNOSIS — Z9851 Tubal ligation status: Secondary | ICD-10-CM | POA: Diagnosis not present

## 2014-10-15 DIAGNOSIS — Z7952 Long term (current) use of systemic steroids: Secondary | ICD-10-CM | POA: Diagnosis not present

## 2014-10-15 DIAGNOSIS — R1084 Generalized abdominal pain: Secondary | ICD-10-CM

## 2014-10-15 DIAGNOSIS — N200 Calculus of kidney: Secondary | ICD-10-CM | POA: Diagnosis not present

## 2014-10-15 DIAGNOSIS — R1012 Left upper quadrant pain: Secondary | ICD-10-CM | POA: Diagnosis present

## 2014-10-15 LAB — BASIC METABOLIC PANEL
Anion gap: 9 (ref 5–15)
BUN: 13 mg/dL (ref 6–23)
CO2: 21 mmol/L (ref 19–32)
CREATININE: 1.03 mg/dL (ref 0.50–1.10)
Calcium: 9 mg/dL (ref 8.4–10.5)
Chloride: 106 mmol/L (ref 96–112)
GFR calc Af Amer: 84 mL/min — ABNORMAL LOW (ref >90)
GFR, EST NON AFRICAN AMERICAN: 72 mL/min — AB (ref >90)
Glucose, Bld: 98 mg/dL (ref 70–99)
Potassium: 3.8 mmol/L (ref 3.5–5.1)
Sodium: 136 mmol/L (ref 135–145)

## 2014-10-15 LAB — HEPATIC FUNCTION PANEL
ALT: 15 U/L (ref 0–35)
AST: 21 U/L (ref 0–37)
Albumin: 3.7 g/dL (ref 3.5–5.2)
Alkaline Phosphatase: 75 U/L (ref 39–117)
BILIRUBIN TOTAL: 0.6 mg/dL (ref 0.3–1.2)
Bilirubin, Direct: 0.1 mg/dL (ref 0.0–0.5)
Indirect Bilirubin: 0.5 mg/dL (ref 0.3–0.9)
Total Protein: 7.3 g/dL (ref 6.0–8.3)

## 2014-10-15 LAB — LIPASE, BLOOD: Lipase: 23 U/L (ref 11–59)

## 2014-10-15 LAB — URINE MICROSCOPIC-ADD ON

## 2014-10-15 LAB — DIFFERENTIAL
Basophils Absolute: 0 10*3/uL (ref 0.0–0.1)
Basophils Relative: 0 % (ref 0–1)
EOS PCT: 0 % (ref 0–5)
Eosinophils Absolute: 0.1 10*3/uL (ref 0.0–0.7)
LYMPHS PCT: 13 % (ref 12–46)
Lymphs Abs: 1.9 10*3/uL (ref 0.7–4.0)
MONOS PCT: 9 % (ref 3–12)
Monocytes Absolute: 1.3 10*3/uL — ABNORMAL HIGH (ref 0.1–1.0)
NEUTROS PCT: 78 % — AB (ref 43–77)
Neutro Abs: 11.2 10*3/uL — ABNORMAL HIGH (ref 1.7–7.7)

## 2014-10-15 LAB — CBC
HCT: 39.3 % (ref 36.0–46.0)
Hemoglobin: 13.1 g/dL (ref 12.0–15.0)
MCH: 27.5 pg (ref 26.0–34.0)
MCHC: 33.3 g/dL (ref 30.0–36.0)
MCV: 82.4 fL (ref 78.0–100.0)
PLATELETS: 353 10*3/uL (ref 150–400)
RBC: 4.77 MIL/uL (ref 3.87–5.11)
RDW: 13.6 % (ref 11.5–15.5)
WBC: 14.6 10*3/uL — AB (ref 4.0–10.5)

## 2014-10-15 LAB — URINALYSIS, ROUTINE W REFLEX MICROSCOPIC
BILIRUBIN URINE: NEGATIVE
Glucose, UA: NEGATIVE mg/dL
Ketones, ur: NEGATIVE mg/dL
LEUKOCYTES UA: NEGATIVE
Nitrite: NEGATIVE
Protein, ur: NEGATIVE mg/dL
Urobilinogen, UA: 0.2 mg/dL (ref 0.0–1.0)
pH: 5.5 (ref 5.0–8.0)

## 2014-10-15 LAB — POC URINE PREG, ED: PREG TEST UR: NEGATIVE

## 2014-10-15 MED ORDER — ONDANSETRON 4 MG PO TBDP: 4.0000 mg | ORAL_TABLET | Freq: Three times a day (TID) | ORAL | Status: DC | PRN

## 2014-10-15 MED ORDER — OXYCODONE-ACETAMINOPHEN 5-325 MG PO TABS
2.0000 | ORAL_TABLET | Freq: Once | ORAL | Status: AC
  Administered 2014-10-15: 2 via ORAL
  Filled 2014-10-15: qty 2

## 2014-10-15 MED ORDER — IBUPROFEN 800 MG PO TABS: 800.0000 mg | ORAL_TABLET | Freq: Three times a day (TID) | ORAL | Status: DC | PRN

## 2014-10-15 MED ORDER — MORPHINE SULFATE 4 MG/ML IJ SOLN
4.0000 mg | Freq: Once | INTRAMUSCULAR | Status: AC
Start: 2014-10-15 — End: 2014-10-15
  Administered 2014-10-15: 4 mg via INTRAVENOUS
  Filled 2014-10-15: qty 1

## 2014-10-15 MED ORDER — TAMSULOSIN HCL 0.4 MG PO CAPS: 0.4000 mg | ORAL_CAPSULE | Freq: Every day | ORAL | Status: DC

## 2014-10-15 MED ORDER — OXYCODONE-ACETAMINOPHEN 5-325 MG PO TABS: 1.0000 | ORAL_TABLET | Freq: Four times a day (QID) | ORAL | Status: DC | PRN

## 2014-10-15 MED ORDER — ONDANSETRON 8 MG PO TBDP
8.0000 mg | ORAL_TABLET | Freq: Once | ORAL | Status: AC
  Administered 2014-10-15: 8 mg via ORAL
  Filled 2014-10-15: qty 1

## 2014-10-15 NOTE — ED Notes (Signed)
Onset yesterday, sharp pains in left side and into lower abdomen, hx of kidney stone. Nausea, no diarrhea

## 2014-10-15 NOTE — Discharge Instructions (Signed)
Kidney Stones  Kidney stones (urolithiasis) are deposits that form inside your kidneys. The intense pain is caused by the stone moving through the urinary tract. When the stone moves, the ureter goes into spasm around the stone. The stone is usually passed in the urine.   CAUSES   · A disorder that makes certain neck glands produce too much parathyroid hormone (primary hyperparathyroidism).  · A buildup of uric acid crystals, similar to gout in your joints.  · Narrowing (stricture) of the ureter.  · A kidney obstruction present at birth (congenital obstruction).  · Previous surgery on the kidney or ureters.  · Numerous kidney infections.  SYMPTOMS   · Feeling sick to your stomach (nauseous).  · Throwing up (vomiting).  · Blood in the urine (hematuria).  · Pain that usually spreads (radiates) to the groin.  · Frequency or urgency of urination.  DIAGNOSIS   · Taking a history and physical exam.  · Blood or urine tests.  · CT scan.  · Occasionally, an examination of the inside of the urinary bladder (cystoscopy) is performed.  TREATMENT   · Observation.  · Increasing your fluid intake.  · Extracorporeal shock wave lithotripsy--This is a noninvasive procedure that uses shock waves to break up kidney stones.  · Surgery may be needed if you have severe pain or persistent obstruction. There are various surgical procedures. Most of the procedures are performed with the use of small instruments. Only small incisions are needed to accommodate these instruments, so recovery time is minimized.  The size, location, and chemical composition are all important variables that will determine the proper choice of action for you. Talk to your health care provider to better understand your situation so that you will minimize the risk of injury to yourself and your kidney.   HOME CARE INSTRUCTIONS   · Drink enough water and fluids to keep your urine clear or pale yellow. This will help you to pass the stone or stone fragments.  · Strain  all urine through the provided strainer. Keep all particulate matter and stones for your health care provider to see. The stone causing the pain may be as small as a grain of salt. It is very important to use the strainer each and every time you pass your urine. The collection of your stone will allow your health care provider to analyze it and verify that a stone has actually passed. The stone analysis will often identify what you can do to reduce the incidence of recurrences.  · Only take over-the-counter or prescription medicines for pain, discomfort, or fever as directed by your health care provider.  · Make a follow-up appointment with your health care provider as directed.  · Get follow-up X-rays if required. The absence of pain does not always mean that the stone has passed. It may have only stopped moving. If the urine remains completely obstructed, it can cause loss of kidney function or even complete destruction of the kidney. It is your responsibility to make sure X-rays and follow-ups are completed. Ultrasounds of the kidney can show blockages and the status of the kidney. Ultrasounds are not associated with any radiation and can be performed easily in a matter of minutes.  SEEK MEDICAL CARE IF:  · You experience pain that is progressive and unresponsive to any pain medicine you have been prescribed.  SEEK IMMEDIATE MEDICAL CARE IF:   · Pain cannot be controlled with the prescribed medicine.  · You have a fever or   shaking chills.  · The severity or intensity of pain increases over 18 hours and is not relieved by pain medicine.  · You develop a new onset of abdominal pain.  · You feel faint or pass out.  · You are unable to urinate.  MAKE SURE YOU:   · Understand these instructions.  · Will watch your condition.  · Will get help right away if you are not doing well or get worse.  Document Released: 07/04/2005 Document Revised: 03/06/2013 Document Reviewed: 12/05/2012  ExitCare® Patient Information ©2015  [employer]. This information is not intended to replace advice given to you by your health care provider. Make sure you discuss any questions you have with your health care provider.    Dietary Guidelines to Help Prevent Kidney Stones  Your risk of kidney stones can be decreased by adjusting the foods you eat. The most important thing you can do is drink enough fluid. You should drink enough fluid to keep your urine clear or pale yellow. The following guidelines provide specific information for the type of kidney stone you have had.  GUIDELINES ACCORDING TO TYPE OF KIDNEY STONE  Calcium Oxalate Kidney Stones  · Reduce the amount of salt you eat. Foods that have a lot of salt cause your body to release excess calcium into your urine. The excess calcium can combine with a substance called oxalate to form kidney stones.  · Reduce the amount of animal protein you eat if the amount you eat is excessive. Animal protein causes your body to release excess calcium into your urine. Ask your dietitian how much protein from animal sources you should be eating.  · Avoid foods that are high in oxalates. If you take vitamins, they should have less than 500 mg of vitamin C. Your body turns vitamin C into oxalates. You do not need to avoid fruits and vegetables high in vitamin C.  Calcium Phosphate Kidney Stones  · Reduce the amount of salt you eat to help prevent the release of excess calcium into your urine.  · Reduce the amount of animal protein you eat if the amount you eat is excessive. Animal protein causes your body to release excess calcium into your urine. Ask your dietitian how much protein from animal sources you should be eating.  · Get enough calcium from food or take a calcium supplement (ask your dietitian for recommendations). Food sources of calcium that do not increase your risk of kidney stones include:  ¨ Broccoli.  ¨ Dairy products, such as cheese and yogurt.  ¨ Pudding.  Uric Acid Kidney Stones  · Do not  have more than 6 oz of animal protein per day.  FOOD SOURCES  Animal Protein Sources  · Meat (all types).  · Poultry.  · Eggs.  · Fish, seafood.  Foods High in Salt  · Salt seasonings.  · Soy sauce.  · Teriyaki sauce.  · Cured and processed meats.  · Salted crackers and snack foods.  · Fast food.  · Canned soups and most canned foods.  Foods High in Oxalates  · Grains:  ¨ Amaranth.  ¨ Barley.  ¨ Grits.  ¨ Wheat germ.  ¨ Bran.  ¨ Buckwheat flour.  ¨ All bran cereals.  ¨ Pretzels.  ¨ Whole wheat bread.  · Vegetables:  ¨ Beans (wax).  ¨ Beets and beet greens.  ¨ Collard greens.  ¨ Eggplant.  ¨ Escarole.  ¨ Leeks.  ¨ Okra.  ¨ Parsley.  ¨ Rutabagas.  ¨   Spinach.  ¨ Swiss chard.  ¨ Tomato paste.  ¨ Fried potatoes.  ¨ Sweet potatoes.  · Fruits:  ¨ Red currants.  ¨ Figs.  ¨ Kiwi.  ¨ Rhubarb.  · Meat and Other Protein Sources:  ¨ Beans (dried).  ¨ Soy burgers and other soybean products.  ¨ Miso.  ¨ Nuts (peanuts, almonds, pecans, cashews, hazelnuts).  ¨ Nut butters.  ¨ Sesame seeds and tahini (paste made of sesame seeds).  ¨ Poppy seeds.  · Beverages:  ¨ Chocolate drink mixes.  ¨ Soy milk.  ¨ Instant iced tea.  ¨ Juices made from high-oxalate fruits or vegetables.  · Other:  ¨ Carob.  ¨ Chocolate.  ¨ Fruitcake.  ¨ Marmalades.  Document Released: 10/29/2010 Document Revised: 07/09/2013 Document Reviewed: 05/31/2013  ExitCare® Patient Information ©2015 [employer]. This information is not intended to replace advice given to you by your health care provider. Make sure you discuss any questions you have with your health care provider.

## 2014-10-15 NOTE — ED Provider Notes (Signed)
5:30 PM  Assumed care from Dr. Bebe ShaggyWickline.  Pt is a 29 y.o. female with prior history of kidney stones, tubal ligation who presents emergency department with left flank and abdominal pain. Unremarkable pelvic exam by previous physician. Labs show leukocytosis with left shift. She is afebrile.  Urine shows hematuria and squamous cells but no other sign of infection. CT scan shows moderate left-sided obstruction secondary to a 9 mm midureteral stone. Patient's pain is been well controlled after 1 round of morphine and oral Percocet. Will consult urology on call for outpatient follow-up.  5:40 PM  D/w Dr. Berneice HeinrichManny with [facility]. Appreciate urology consult. He agrees with outpatient follow-up as patient will likely need procedure for stone removal. We'll have patient call office tomorrow. Discussed this with patient and mother at bedside. We'll discharge with pain and nausea medicine as well as Flomax. Discussed return precautions. She verbalized understanding and is comfortable with plan.  Angela MawKristen N Riyansh Gerstner, DO 10/15/14 [id number]

## 2014-10-15 NOTE — ED Provider Notes (Signed)
CSN: [id number]     Arrival date & time 10/15/14  1339 History   First MD Initiated Contact with Patient 10/15/14 1428     Chief Complaint  Patient presents with  . Abdominal Pain    Patient is a 29 y.o. female presenting with abdominal pain. The history is provided by the patient.  Abdominal Pain Pain location:  LUQ Pain quality: aching   Pain radiates to:  LLQ Pain severity:  Moderate Onset quality:  Gradual Duration:  1 day Timing:  Constant Progression:  Worsening Chronicity:  New Relieved by:  Nothing Worsened by:  Movement and palpation Associated symptoms: nausea   Associated symptoms: no chest pain, no dysuria, no fever, no shortness of breath, no vaginal bleeding, no vaginal discharge and no vomiting     Past Medical History  Diagnosis Date  . Renal disorder   . Back pain    Past Surgical History  Procedure Laterality Date  . Tubal ligation    . Stone extraction with basket     No family history on file. History  Substance Use Topics  . Smoking status: Never Smoker   . Smokeless tobacco: Not on file  . Alcohol Use: No   OB History    No data available     Review of Systems  Constitutional: Negative for fever.  Respiratory: Negative for shortness of breath.   Cardiovascular: Negative for chest pain.  Gastrointestinal: Positive for nausea and abdominal pain. Negative for vomiting.  Genitourinary: Negative for dysuria, vaginal bleeding and vaginal discharge.  All other systems reviewed and are negative.     Allergies  Vicodin  Home Medications   Prior to Admission medications   Medication Sig Start Date End Date Taking? Authorizing Provider  diphenhydrAMINE (BENADRYL) 25 MG tablet Take 50 mg by mouth at bedtime as needed for sleep.   Yes Historical Provider, MD  HYDROcodone-acetaminophen (NORCO) 5-325 MG per tablet Take 1 tablet by mouth every 6 (six) hours as needed (back pain).    Yes Historical Provider, MD  ibuprofen (ADVIL,MOTRIN) 200 MG  tablet Take 800 mg by mouth every 6 (six) hours as needed. pain   Yes Historical Provider, MD  medroxyPROGESTERone (DEPO-PROVERA) 150 MG/ML injection Inject 1 mL into the muscle every 3 (three) months. 10/06/14  Yes Historical Provider, MD  methocarbamol (ROBAXIN-750) 750 MG tablet 1-2 tabs po QID for low back pain Patient not taking: Reported on 10/15/2014 11/27/11   Worthy Rancherichard M Miller, PA-C  predniSONE (DELTASONE) 10 MG tablet Take 2 tablets (20 mg total) by mouth daily. Patient not taking: Reported on 10/15/2014 12/17/11   Bethann BerkshireJoseph Zammit, MD  promethazine (PHENERGAN) 25 MG tablet Take 1 tablet (25 mg total) by mouth every 6 (six) hours as needed for nausea. Patient not taking: Reported on 10/15/2014 09/17/11   Bethann BerkshireJoseph Zammit, MD   BP 138/98 mmHg  Pulse 110  Temp(Src) 98 F (36.7 C) (Oral)  Resp 18  Ht 5\' 2"  (1.575 m)  Wt 228 lb (103.42 kg)  BMI 41.69 kg/m2  SpO2 100% Physical Exam CONSTITUTIONAL: Well developed/well nourished HEAD: Normocephalic/atraumatic EYES: EOMI/PERRL ENMT: Mucous membranes moist NECK: supple no meningeal signs SPINE/BACK:entire spine nontender CV: S1/S2 noted, no murmurs/rubs/gallops noted LUNGS: Lungs are clear to auscultation bilaterally, no apparent distress ABDOMEN: soft, mild LLQ tenderness, no rebound or guarding, bowel sounds noted throughout abdomen GU:no cva tenderness. Pelvic exam with female chaperone (nurse tech) no cmt, there is mild left adnexal tenderness without mass, no right adnexal tenderness, no vag bleeding/discharge,  no uterine tenderness NEURO: Pt is awake/alert/appropriate, moves all extremitiesx4.  No facial droop.   EXTREMITIES: pulses normal/equal, full ROM SKIN: warm, color normal PSYCH: no abnormalities of mood noted, alert and oriented to situation  ED Course  Procedures  Labs Review Labs Reviewed  URINALYSIS, ROUTINE W REFLEX MICROSCOPIC - Abnormal; Notable for the following:    Specific Gravity, Urine <1.005 (*)    Hgb urine  dipstick MODERATE (*)    All other components within normal limits  URINE MICROSCOPIC-ADD ON - Abnormal; Notable for the following:    Squamous Epithelial / LPF FEW (*)    All other components within normal limits  POC URINE PREG, ED   3:56 PM Pt with LUQ pain that radiates to LLQ Reports previous h/o kidney stone  Will proceed with CT imaging at patient request Signed out to dr ward to f/u on CT imaging.  If negative, pt can be discharged home (pelvic exam unremarkable, doubt TOA/torsion at this time)  MDM   Final diagnoses:  None    Nursing notes including past medical history and social history reviewed and considered in documentation Labs/vital reviewed myself and considered during evaluation     Zadie Rhine, MD 10/15/14 1557

## 2014-10-15 NOTE — ED Notes (Signed)
Pt denies any complaints at this time other than left lower quad pain and nausea.  States comes and goes and feels it may be another kidney stone.  Pt resting watching tv with family at bedside.  Will continue to monitor.

## 2014-10-16 ENCOUNTER — Other Ambulatory Visit: Payer: Self-pay | Admitting: Urology

## 2014-10-17 ENCOUNTER — Ambulatory Visit (HOSPITAL_BASED_OUTPATIENT_CLINIC_OR_DEPARTMENT_OTHER)
Admission: RE | Admit: 2014-10-17 | Discharge: 2014-10-17 | Disposition: A | Discharge status: Home / Self Care | Payer: BLUE CROSS/BLUE SHIELD | Source: Ambulatory Visit | Attending: Urology | Admitting: Urology

## 2014-10-17 ENCOUNTER — Encounter (HOSPITAL_BASED_OUTPATIENT_CLINIC_OR_DEPARTMENT_OTHER)
Admission: RE | Disposition: A | Discharge status: Home / Self Care | Payer: Self-pay | Source: Ambulatory Visit | Attending: Urology

## 2014-10-17 ENCOUNTER — Ambulatory Visit (HOSPITAL_COMMUNITY): Payer: BLUE CROSS/BLUE SHIELD

## 2014-10-17 ENCOUNTER — Ambulatory Visit (HOSPITAL_BASED_OUTPATIENT_CLINIC_OR_DEPARTMENT_OTHER): Payer: BLUE CROSS/BLUE SHIELD | Admitting: Anesthesiology

## 2014-10-17 ENCOUNTER — Encounter (HOSPITAL_BASED_OUTPATIENT_CLINIC_OR_DEPARTMENT_OTHER): Payer: Self-pay

## 2014-10-17 DIAGNOSIS — Z9851 Tubal ligation status: Secondary | ICD-10-CM | POA: Diagnosis not present

## 2014-10-17 DIAGNOSIS — N135 Crossing vessel and stricture of ureter without hydronephrosis: Secondary | ICD-10-CM | POA: Insufficient documentation

## 2014-10-17 DIAGNOSIS — I10 Essential (primary) hypertension: Secondary | ICD-10-CM | POA: Insufficient documentation

## 2014-10-17 DIAGNOSIS — Z6841 Body Mass Index (BMI) 40.0 and over, adult: Secondary | ICD-10-CM | POA: Diagnosis not present

## 2014-10-17 DIAGNOSIS — F419 Anxiety disorder, unspecified: Secondary | ICD-10-CM | POA: Insufficient documentation

## 2014-10-17 DIAGNOSIS — N201 Calculus of ureter: Secondary | ICD-10-CM | POA: Diagnosis present

## 2014-10-17 DIAGNOSIS — Z888 Allergy status to other drugs, medicaments and biological substances status: Secondary | ICD-10-CM | POA: Insufficient documentation

## 2014-10-17 HISTORY — PX: HOLMIUM LASER APPLICATION: SHX5852

## 2014-10-17 HISTORY — PX: CYSTOSCOPY WITH RETROGRADE PYELOGRAM, URETEROSCOPY AND STENT PLACEMENT: SHX5789

## 2014-10-17 SURGERY — CYSTOURETEROSCOPY, WITH RETROGRADE PYELOGRAM AND STENT INSERTION
Anesthesia: General | Site: Ureter | Laterality: Left

## 2014-10-17 MED ORDER — IOHEXOL 350 MG/ML SOLN
INTRAVENOUS | Status: DC | PRN
  Administered 2014-10-17: 15 mL

## 2014-10-17 MED ORDER — MIDAZOLAM HCL 5 MG/5ML IJ SOLN
INTRAMUSCULAR | Status: DC | PRN
  Administered 2014-10-17: 2 mg via INTRAVENOUS

## 2014-10-17 MED ORDER — CIPROFLOXACIN IN D5W 200 MG/100ML IV SOLN
INTRAVENOUS | Status: AC
  Filled 2014-10-17: qty 100

## 2014-10-17 MED ORDER — PROMETHAZINE HCL 25 MG/ML IJ SOLN
6.2500 mg | INTRAMUSCULAR | Status: DC | PRN
  Filled 2014-10-17: qty 1

## 2014-10-17 MED ORDER — OXYBUTYNIN CHLORIDE 5 MG PO TABS
5.0000 mg | ORAL_TABLET | Freq: Once | ORAL | Status: AC
  Administered 2014-10-17: 5 mg via ORAL
  Filled 2014-10-17: qty 1

## 2014-10-17 MED ORDER — FENTANYL CITRATE 0.05 MG/ML IJ SOLN
INTRAMUSCULAR | Status: DC | PRN
  Administered 2014-10-17: 25 ug via INTRAVENOUS
  Administered 2014-10-17: 50 ug via INTRAVENOUS
  Administered 2014-10-17 (×3): 25 ug via INTRAVENOUS
  Administered 2014-10-17: 50 ug via INTRAVENOUS

## 2014-10-17 MED ORDER — LACTATED RINGERS IV SOLN
INTRAVENOUS | Status: DC
  Administered 2014-10-17 (×2): via INTRAVENOUS
  Filled 2014-10-17: qty 1000

## 2014-10-17 MED ORDER — PROPOFOL 10 MG/ML IV BOLUS
INTRAVENOUS | Status: DC | PRN
  Administered 2014-10-17: 200 mg via INTRAVENOUS

## 2014-10-17 MED ORDER — DEXAMETHASONE SODIUM PHOSPHATE 4 MG/ML IJ SOLN
INTRAMUSCULAR | Status: DC | PRN
  Administered 2014-10-17: 10 mg via INTRAVENOUS

## 2014-10-17 MED ORDER — OXYCODONE HCL 10 MG PO TABS: 10.0000 mg | ORAL_TABLET | ORAL | Status: DC | PRN

## 2014-10-17 MED ORDER — PHENAZOPYRIDINE HCL 200 MG PO TABS
200.0000 mg | ORAL_TABLET | Freq: Once | ORAL | Status: AC
  Administered 2014-10-17: 200 mg via ORAL
  Filled 2014-10-17: qty 1

## 2014-10-17 MED ORDER — MIDAZOLAM HCL 2 MG/2ML IJ SOLN
INTRAMUSCULAR | Status: AC
  Filled 2014-10-17: qty 2

## 2014-10-17 MED ORDER — OXYCODONE HCL 5 MG/5ML PO SOLN
5.0000 mg | Freq: Once | ORAL | Status: AC | PRN
  Filled 2014-10-17: qty 5

## 2014-10-17 MED ORDER — OXYCODONE HCL 5 MG PO TABS
ORAL_TABLET | ORAL | Status: AC
  Filled 2014-10-17: qty 1

## 2014-10-17 MED ORDER — ONDANSETRON HCL 4 MG/2ML IJ SOLN
INTRAMUSCULAR | Status: DC | PRN
  Administered 2014-10-17: 4 mg via INTRAVENOUS

## 2014-10-17 MED ORDER — OXYCODONE HCL 5 MG PO TABS
5.0000 mg | ORAL_TABLET | Freq: Once | ORAL | Status: AC | PRN
  Administered 2014-10-17: 5 mg via ORAL
  Filled 2014-10-17: qty 1

## 2014-10-17 MED ORDER — CIPROFLOXACIN IN D5W 200 MG/100ML IV SOLN
200.0000 mg | INTRAVENOUS | Status: AC
Start: 2014-10-17 — End: 2014-10-17
  Administered 2014-10-17: 200 mg via INTRAVENOUS
  Filled 2014-10-17: qty 100

## 2014-10-17 MED ORDER — LIDOCAINE HCL (CARDIAC) 20 MG/ML IV SOLN
INTRAVENOUS | Status: DC | PRN
  Administered 2014-10-17: 60 mg via INTRAVENOUS

## 2014-10-17 MED ORDER — HYDROMORPHONE HCL 1 MG/ML IJ SOLN
INTRAMUSCULAR | Status: AC
  Filled 2014-10-17: qty 1

## 2014-10-17 MED ORDER — HYDROMORPHONE HCL 1 MG/ML IJ SOLN
0.2500 mg | INTRAMUSCULAR | Status: DC | PRN
  Administered 2014-10-17: 0.25 mg via INTRAVENOUS
  Filled 2014-10-17: qty 1

## 2014-10-17 MED ORDER — TAMSULOSIN HCL 0.4 MG PO CAPS
0.4000 mg | ORAL_CAPSULE | Freq: Once | ORAL | Status: DC
  Filled 2014-10-17: qty 1

## 2014-10-17 MED ORDER — PHENAZOPYRIDINE HCL 100 MG PO TABS
ORAL_TABLET | ORAL | Status: AC
Start: 2014-10-17 — End: 2014-10-17
  Filled 2014-10-17: qty 2

## 2014-10-17 MED ORDER — FENTANYL CITRATE 0.05 MG/ML IJ SOLN
INTRAMUSCULAR | Status: AC
  Filled 2014-10-17: qty 4

## 2014-10-17 MED ORDER — PHENAZOPYRIDINE HCL 200 MG PO TABS: 200.0000 mg | ORAL_TABLET | Freq: Three times a day (TID) | ORAL | Status: DC | PRN

## 2014-10-17 MED ORDER — OXYBUTYNIN CHLORIDE 5 MG PO TABS
ORAL_TABLET | ORAL | Status: AC
  Filled 2014-10-17: qty 1

## 2014-10-17 MED ORDER — SODIUM CHLORIDE 0.9 % IR SOLN
Status: DC | PRN
  Administered 2014-10-17: 4000 mL

## 2014-10-17 SURGICAL SUPPLY — 41 items
ADAPTER CATH URET PLST 4-6FR (CATHETERS) IMPLANT
BAG DRAIN URO-CYSTO SKYTR STRL (DRAIN) ×2 IMPLANT
BASKET LASER NITINOL 1.9FR (BASKET) IMPLANT
BASKET STNLS GEMINI 4WIRE 3FR (BASKET) IMPLANT
BASKET STONE 1.7 NGAGE (UROLOGICAL SUPPLIES) ×2 IMPLANT
BASKET ZERO TIP NITINOL 2.4FR (BASKET) ×2 IMPLANT
CANISTER SUCT LVC 12 LTR MEDI- (MISCELLANEOUS) ×2 IMPLANT
CATH CLEAR GEL 3F BACKSTOP (CATHETERS) IMPLANT
CATH INTERMIT  6FR 70CM (CATHETERS) IMPLANT
CATH URET 5FR 28IN CONE TIP (BALLOONS)
CATH URET 5FR 70CM CONE TIP (BALLOONS) IMPLANT
CLOTH BEACON ORANGE TIMEOUT ST (SAFETY) ×2 IMPLANT
ELECT REM PT RETURN 9FT ADLT (ELECTROSURGICAL)
ELECTRODE REM PT RTRN 9FT ADLT (ELECTROSURGICAL) IMPLANT
FIBER LASER FLEXIVA 200 (UROLOGICAL SUPPLIES) IMPLANT
FIBER LASER FLEXIVA 365 (UROLOGICAL SUPPLIES) IMPLANT
FIBER LASER FLEXIVA 550 (UROLOGICAL SUPPLIES) IMPLANT
FIBER LASER TRAC TIP (UROLOGICAL SUPPLIES) ×2 IMPLANT
GLOVE BIO SURGEON STRL SZ 6.5 (GLOVE) ×2 IMPLANT
GLOVE BIO SURGEON STRL SZ7 (GLOVE) ×2 IMPLANT
GLOVE BIO SURGEON STRL SZ8 (GLOVE) ×2 IMPLANT
GLOVE BIOGEL PI IND STRL 6.5 (GLOVE) ×2 IMPLANT
GLOVE BIOGEL PI INDICATOR 6.5 (GLOVE) ×2
GOWN PREVENTION PLUS LG XLONG (DISPOSABLE) IMPLANT
GOWN STRL REIN XL XLG (GOWN DISPOSABLE) IMPLANT
GOWN STRL REUS W/TWL LRG LVL3 (GOWN DISPOSABLE) ×2 IMPLANT
GOWN STRL REUS W/TWL XL LVL3 (GOWN DISPOSABLE) ×4 IMPLANT
GUIDEWIRE 0.038 PTFE COATED (WIRE) IMPLANT
GUIDEWIRE ANG ZIPWIRE 038X150 (WIRE) IMPLANT
GUIDEWIRE STR DUAL SENSOR (WIRE) ×2 IMPLANT
IV NS IRRIG 3000ML ARTHROMATIC (IV SOLUTION) ×2 IMPLANT
KIT BALLIN UROMAX 15FX10 (LABEL) IMPLANT
KIT BALLN UROMAX 15FX4 (MISCELLANEOUS) ×1 IMPLANT
KIT BALLN UROMAX 26 75X4 (MISCELLANEOUS) ×1
NS IRRIG 1000ML POUR BTL (IV SOLUTION) ×2 IMPLANT
PACK CYSTO (CUSTOM PROCEDURE TRAY) ×2 IMPLANT
SET HIGH PRES BAL DIL (LABEL)
SHEATH ACCESS URETERAL 38CM (SHEATH) ×2 IMPLANT
STENT URET 6FRX24 CONTOUR (STENTS) ×2 IMPLANT
WATER STERILE IRR 3000ML UROMA (IV SOLUTION) IMPLANT
WATER STERILE IRR 500ML POUR (IV SOLUTION) ×2 IMPLANT

## 2014-10-17 NOTE — Transfer of Care (Signed)
Immediate Anesthesia Transfer of Care Note  Patient: [name]  Procedure(s) Performed: Procedure(s) (LRB): CYSTOSCOPY AND URETERAL DILATION WITH LEFT RETROGRADE PYELOGRAM, URETEROSCOPY AND STENT PLACEMENT (Left) HOLMIUM LASER LITHOTRIPSY  (Left)  Patient Location: PACU  Anesthesia Type: General  Level of Consciousness: awake, oriented, sedated and patient cooperative  Airway & Oxygen Therapy: Patient Spontanous Breathing and Patient connected to face mask oxygen  Post-op Assessment: Report given to PACU RN and Post -op Vital signs reviewed and stable  Post vital signs: Reviewed and stable  Complications: No apparent anesthesia complications

## 2014-10-17 NOTE — Discharge Instructions (Signed)

## 2014-10-17 NOTE — Anesthesia Preprocedure Evaluation (Signed)
Anesthesia Evaluation  Patient identified by MRN, date of birth, ID band Patient awake    Reviewed: Allergy & Precautions, NPO status , Patient's Chart, lab work & pertinent test results  History of Anesthesia Complications Negative for: history of anesthetic complications  Airway Mallampati: I       Dental  (+) Teeth Intact   Pulmonary neg pulmonary ROS,  breath sounds clear to auscultation        Cardiovascular negative cardio ROS  Rhythm:Regular Rate:Normal     Neuro/Psych negative neurological ROS     GI/Hepatic negative GI ROS, Neg liver ROS,   Endo/Other  negative endocrine ROSMorbid obesity  Renal/GU stones     Musculoskeletal   Abdominal   Peds  Hematology negative hematology ROS (+)   Anesthesia Other Findings   Reproductive/Obstetrics                             Anesthesia Physical Anesthesia Plan  ASA: II  Anesthesia Plan: General   Post-op Pain Management:    Induction: Intravenous  Airway Management Planned: LMA  Additional Equipment:   Intra-op Plan:   Post-operative Plan: Extubation in OR  Informed Consent: I have reviewed the patients History and Physical, chart, labs and discussed the procedure including the risks, benefits and alternatives for the proposed anesthesia with the patient or authorized representative who has indicated his/her understanding and acceptance.   Dental advisory given  Plan Discussed with: CRNA and Surgeon  Anesthesia Plan Comments:         Anesthesia Quick Evaluation

## 2014-10-17 NOTE — Anesthesia Procedure Notes (Signed)
Procedure Name: LMA Insertion Date/Time: 10/17/2014 10:49 AM Performed by: Renella CunasHAZEL, Markeda Narvaez D Pre-anesthesia Checklist: Patient identified, Emergency Drugs available, Suction available and Patient being monitored Patient Re-evaluated:Patient Re-evaluated prior to inductionOxygen Delivery Method: Circle System Utilized Preoxygenation: Pre-oxygenation with 100% oxygen Intubation Type: IV induction Ventilation: Mask ventilation without difficulty LMA: LMA inserted LMA Size: 4.0 Number of attempts: 1 Airway Equipment and Method: Bite block Placement Confirmation: positive ETCO2 Tube secured with: Tape Dental Injury: Teeth and Oropharynx as per pre-operative assessment

## 2014-10-17 NOTE — Op Note (Signed)
Preoperative diagnosis: Left ureteral calculus  Postoperative diagnosis: Left ureteral calculus  Procedure:  1. Cystoscopy 2. Left ureteral balloon dilation 3. Left ureteroscopy and stone removal 4. Ureteroscopic laser lithotripsy 5. Left ureteral stent placement (6Fr x 24 cm) 6. Left retrograde pyelography with interpretation  Surgeon: Dr. Ihor GullyMark Ottelin  Resident: Elon Jesteravid C. Kaisha Wachob, MD  Anesthesia: General  Complications: None  Intraoperative findings: Left retrograde pyelography demonstrated no hydronephrosis, small renal pelvis and filling defect at expected level of the stone.  EBL: Minimal  Specimens: 1. Left ureteral calculus  Disposition of specimens: [facility] for stone analysis  Indication: [name] is a 29 y.o. female patient with urolithiasis. After reviewing the management options for treatment, they elected to proceed with the above surgical procedure(s). We have discussed the potential benefits and risks of the procedure, side effects of the proposed treatment, the likelihood of the patient achieving the goals of the procedure, and any potential problems that might occur during the procedure or recuperation. Informed consent has been obtained.  Description of procedure:  The patient was taken to the operating room and general anesthesia was induced.  The patient was placed in the dorsal lithotomy position, prepped and draped in the usual sterile fashion, and preoperative antibiotics were administered. A preoperative time-out was performed.   Cystourethroscopy was performed.  The patient's urethra was examined and was normal. The bladder was then systematically examined in its entirety. There was no evidence for any bladder tumors, stones, or other mucosal pathology.    Attention then turned to the Left ureteral orifice and a ureteral catheter was used to intubate the ureteral orifice.  Omnipaque contrast was injected through the ureteral  catheter and a retrograde pyelogram was performed with findings as dictated above.  A 0.38 sensor guidewire was then advanced up the Left ureter into the renal pelvis under fluoroscopic guidance. We were initially unable to place the 12/14 Fr ureteral access sheath due to the narrow distal ureter so ureteral balloon dilation was done under fluoroscopy with the 10cm dilating balloon up to 16 PSI. The sheath was then advanced over the guide wire. The digital flexible ureteroscope was then advanced through the access sheath into the ureter next to the guidewire and the calculus was identified and was located in the mid ureter.   The stone was then fragmented with the 200 micron holmium laser fiber on a setting of 0.8J and frequency of 12 Hz.   All sizable stones were then removed with a zero tip nitinol basket.  Reinspection of the ureter/renal pelvis revealed no remaining visible stones or fragments of significant size.   The safety wire was then replaced and the access sheath removed. A ureteral stent was advanced over the wire using Seldinger technique.  The stent was positioned appropriately under fluoroscopic and cystoscopic guidance.  The wire was then removed with an adequate stent curl noted in the renal pelvis as well as in the bladder.  The bladder was then emptied and the procedure ended.  The patient appeared to tolerate the procedure well and without complications.  The patient was able to be awakened and transferred to the recovery unit in satisfactory condition.

## 2014-10-17 NOTE — H&P (Signed)
[name] is a 29 year old female seen as an emergent work in for a left ureteral stone.   History of Present Illness She was seen in the ER on 10/15/14 with a history of gradually increasing pain in the left flank radiating into the left lower quadrant that was moderate in severity and increasing in intensity. It was not relieved by positional change. A CT scan was obtained which revealed no renal calculi and what the radiologist recorded as a 9 mm left ureteral stone however this was the length of the stone and in fact it's width is only 5.5 mm. He has Hounsfield units of 1300.  She has had stones in the past and underwent lithotripsy which was unsuccessful and eventually underwent ureteroscopic management.  She was evaluated for her stones by Dr. Jerre SimonJavaid and was placed on potassium citrate. This seemed to prevent further stone formation for some time but then because she was not forming any more stones the medication was stopped.   Past Medical History Problems  1. History of Anxiety (F41.9) 2. History of hypertension (Z86.79)  Surgical History Problems  1. History of Cystoscopy With Ureteroscopy With Removal Of Calculus 2. History of Lithotripsy 3. History of Tubal Ligation  Current Meds 1. Benadryl 25 MG Oral Tablet;  Therapy: (Recorded:31Mar2016) to Recorded 2. Depo-Provera 150 MG/ML Intramuscular Suspension;  Therapy: (Recorded:31Mar2016) to Recorded 3. Flomax 0.4 MG Oral Capsule;  Therapy: (Recorded:31Mar2016) to Recorded 4. Hydrocodone-Acetaminophen 5-325 MG Oral Tablet;  Therapy: (Recorded:31Mar2016) to Recorded 5. Ibuprofen 200 MG Oral Tablet;  Therapy: (Recorded:31Mar2016) to Recorded 6. Methocarbamol 750 MG Oral Tablet;  Therapy: (Recorded:31Mar2016) to Recorded 7. Promethazine HCl - 25 MG Oral Tablet;  Therapy: (Recorded:31Mar2016) to Recorded  Allergies Medication  1. Vicodin TABS  Family History Problems  1. Family history of Death of family member : Father    at age 10947; lung cancer 2. Family history of lung cancer (Z80.1) : Father 3. 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  Genitourinary: urinary frequency, nocturia and incontinence.  Gastrointestinal: nausea.  Constitutional: feeling tired (fatigue).    Vitals Vital Signs  Height: 5 ft 2 in Weight: 228 lb  BMI Calculated: 41.7 BSA Calculated: 2.02 Blood Pressure: 135 / 78 Temperature: 98.7 F Heart Rate: 113  Physical Exam Constitutional: Well nourished and well developed . No acute distress.   ENT:. The ears and nose are normal in appearance.   Neck: The appearance of the neck is normal and no neck mass is present.   Pulmonary: No respiratory distress and normal respiratory rhythm and effort.   Cardiovascular: Heart rate and rhythm are normal . No peripheral edema.   Abdomen: The abdomen is mildly obese. The abdomen is soft and nontender. No masses are palpated. No CVA tenderness. No hernias are palpable. No hepatosplenomegaly noted.   Lymphatics: The femoral and inguinal nodes are not enlarged or tender.   Skin: Normal skin turgor, no visible rash and no visible skin lesions.   Neuro/Psych:. Mood and affect are appropriate.    Results/Data Urine  COLOR YELLOW  APPEARANCE  CLEAR  SPECIFIC GRAVITY 1.010  pH 6.0  GLUCOSE NEG mg/dL BILIRUBIN NEG  KETONE NEG mg/dL BLOOD MOD  PROTEIN NEG mg/dL UROBILINOGEN 0.2 mg/dL NITRITE NEG  LEUKOCYTE ESTERASE NEG  SQUAMOUS  EPITHELIAL/HPF FEW  WBC 0-2 WBC/hpf RBC 3-6 RBC/hpf BACTERIA RARE  CRYSTALS NONE SEEN  CASTS NONE SEEN   Old records or history reviewed: ER notes from any pain as above.  The following images/tracing/specimen were independently visualized:  CT scan as above.  The following clinical lab reports were reviewed:  UA: 3-6 rbc/hpf otherwise negative.  The following radiology reports were reviewed: CT scan.   KUB today reveals the stone remains unchanged in position in the mid left ureter measuring 6 mm wide.    Assessment   We discussed the management of urinary stones. These options include observation, ureteroscopy, shockwave lithotripsy, and PCNL. We discussed which options are relevant to these particular stones. We discussed the natural history of stones as well as the complications of untreated stones and the impact on quality of life without treatment as well as with each of the above listed treatments. We also discussed the efficacy of each treatment in its ability to clear the stone burden. With any of these management options I discussed the signs and symptoms of infection and the need for emergent treatment should these be experienced. For each option we discussed the ability of each procedure to clear the patient of their stone burden.    For observation I described the risks which include but are not limited to silent renal damage, life-threatening infection, need for emergent surgery, failure to pass stone, and pain.    For ureteroscopy I described the risks which include heart attack, stroke, pulmonary embolus, death, bleeding, infection, damage to contiguous structures, positioning injury, ureteral stricture, ureteral avulsion, ureteral injury, need for ureteral stent, inability to perform ureteroscopy, need for an interval procedure, inability to clear stone burden, stent discomfort and pain.    For shockwave lithotripsy I described the risks which include arrhythmia, kidney  contusion, kidney hemorrhage, need for transfusion, long-term risk of diabetes or hypertension, back discomfort, flank ecchymosis, flank abrasion, inability to break up stone, inability to pass stone fragments, Steinstrasse, infection associated with obstructing stones, need for different surgical procedure and possible need for repeat shockwave lithotripsy.    She is interested in proceeding with treatment of her stone. Because she failed to have her stone adequately fragmented with lithotripsy previously likely because it has Hounsfield units of 1300, we discussed proceeding with ureteroscopic management. She would like to proceed with that as soon as possible. In the meantime I'm going to give her a prescription for Dilaudid to help her with her pain since the oxycodone seems to cause her to feel funny.   Plan  1. Prescription for hydromorphone.  2. She'll be scheduled for ureteroscopic management of her left ureteral stone.

## 2014-10-17 NOTE — Anesthesia Postprocedure Evaluation (Signed)
  Anesthesia Post-op Note  Patient: [name]  Procedure(s) Performed: Procedure(s) (LRB): CYSTOSCOPY AND URETERAL DILATION WITH LEFT RETROGRADE PYELOGRAM, URETEROSCOPY AND STENT PLACEMENT (Left) HOLMIUM LASER LITHOTRIPSY  (Left)  Patient Location: PACU  Anesthesia Type: General  Level of Consciousness: awake and alert   Airway and Oxygen Therapy: Patient Spontanous Breathing  Post-op Pain: mild  Post-op Assessment: Post-op Vital signs reviewed, Patient's Cardiovascular Status Stable, Respiratory Function Stable, Patent Airway and No signs of Nausea or vomiting  Last Vitals:  Filed Vitals:   10/17/14 1215  BP: 150/91  Pulse: 98  Temp:   Resp: 14    Post-op Vital Signs: stable   Complications: No apparent anesthesia complications

## 2014-10-20 ENCOUNTER — Encounter (HOSPITAL_BASED_OUTPATIENT_CLINIC_OR_DEPARTMENT_OTHER): Payer: Self-pay | Admitting: Urology

## 2018-10-21 ENCOUNTER — Emergency Department (HOSPITAL_COMMUNITY): Payer: BLUE CROSS/BLUE SHIELD

## 2018-10-21 ENCOUNTER — Observation Stay (HOSPITAL_COMMUNITY)
Admission: EM | Admit: 2018-10-21 | Discharge: 2018-10-23 | Disposition: A | Discharge status: Home / Self Care | Payer: BLUE CROSS/BLUE SHIELD | Attending: Internal Medicine | Admitting: Internal Medicine

## 2018-10-21 ENCOUNTER — Emergency Department (HOSPITAL_COMMUNITY): Payer: BLUE CROSS/BLUE SHIELD | Admitting: Anesthesiology

## 2018-10-21 ENCOUNTER — Encounter (HOSPITAL_COMMUNITY)
Admission: EM | Disposition: A | Discharge status: Home / Self Care | Payer: Self-pay | Source: Home / Self Care | Attending: Emergency Medicine

## 2018-10-21 ENCOUNTER — Encounter (HOSPITAL_COMMUNITY): Payer: Self-pay | Admitting: *Deleted

## 2018-10-21 ENCOUNTER — Other Ambulatory Visit: Payer: Self-pay

## 2018-10-21 DIAGNOSIS — N136 Pyonephrosis: Secondary | ICD-10-CM | POA: Insufficient documentation

## 2018-10-21 DIAGNOSIS — K121 Other forms of stomatitis: Secondary | ICD-10-CM | POA: Insufficient documentation

## 2018-10-21 DIAGNOSIS — N200 Calculus of kidney: Secondary | ICD-10-CM

## 2018-10-21 DIAGNOSIS — R10A2 Flank pain, left side: Secondary | ICD-10-CM

## 2018-10-21 DIAGNOSIS — N390 Urinary tract infection, site not specified: Secondary | ICD-10-CM

## 2018-10-21 DIAGNOSIS — E66813 Obesity, class 3: Secondary | ICD-10-CM

## 2018-10-21 DIAGNOSIS — Z6841 Body Mass Index (BMI) 40.0 and over, adult: Secondary | ICD-10-CM | POA: Diagnosis not present

## 2018-10-21 DIAGNOSIS — R109 Unspecified abdominal pain: Secondary | ICD-10-CM | POA: Diagnosis present

## 2018-10-21 DIAGNOSIS — A419 Sepsis, unspecified organism: Principal | ICD-10-CM | POA: Insufficient documentation

## 2018-10-21 DIAGNOSIS — N1330 Unspecified hydronephrosis: Secondary | ICD-10-CM

## 2018-10-21 HISTORY — DX: Calculus of kidney: N20.0

## 2018-10-21 HISTORY — PX: CYSTOSCOPY WITH STENT PLACEMENT: SHX5790

## 2018-10-21 LAB — CBC WITH DIFFERENTIAL/PLATELET
Abs Immature Granulocytes: 0.08 10*3/uL — ABNORMAL HIGH (ref 0.00–0.07)
Basophils Absolute: 0 10*3/uL (ref 0.0–0.1)
Basophils Relative: 0 %
Eosinophils Absolute: 0 10*3/uL (ref 0.0–0.5)
Eosinophils Relative: 0 %
HCT: 32.3 % — ABNORMAL LOW (ref 36.0–46.0)
Hemoglobin: 10.3 g/dL — ABNORMAL LOW (ref 12.0–15.0)
Immature Granulocytes: 1 %
Lymphocytes Relative: 9 %
Lymphs Abs: 1.4 10*3/uL (ref 0.7–4.0)
MCH: 25.8 pg — ABNORMAL LOW (ref 26.0–34.0)
MCHC: 31.9 g/dL (ref 30.0–36.0)
MCV: 80.8 fL (ref 80.0–100.0)
Monocytes Absolute: 1.5 10*3/uL — ABNORMAL HIGH (ref 0.1–1.0)
Monocytes Relative: 10 %
Neutro Abs: 11.9 10*3/uL — ABNORMAL HIGH (ref 1.7–7.7)
Neutrophils Relative %: 80 %
Platelets: 459 10*3/uL — ABNORMAL HIGH (ref 150–400)
RBC: 4 MIL/uL (ref 3.87–5.11)
RDW: 14.3 % (ref 11.5–15.5)
WBC: 14.9 10*3/uL — ABNORMAL HIGH (ref 4.0–10.5)
nRBC: 0 % (ref 0.0–0.2)

## 2018-10-21 LAB — BASIC METABOLIC PANEL
Anion gap: 9 (ref 5–15)
BUN: 9 mg/dL (ref 6–20)
CO2: 20 mmol/L — ABNORMAL LOW (ref 22–32)
Calcium: 8.2 mg/dL — ABNORMAL LOW (ref 8.9–10.3)
Chloride: 104 mmol/L (ref 98–111)
Creatinine, Ser: 0.99 mg/dL (ref 0.44–1.00)
GFR calc Af Amer: >60 mL/min (ref >60)
GFR calc non Af Amer: >60 mL/min (ref >60)
Glucose, Bld: 92 mg/dL (ref 70–99)
Potassium: 3.5 mmol/L (ref 3.5–5.1)
Sodium: 133 mmol/L — ABNORMAL LOW (ref 135–145)

## 2018-10-21 LAB — URINALYSIS, ROUTINE W REFLEX MICROSCOPIC
Bilirubin Urine: NEGATIVE
Glucose, UA: NEGATIVE mg/dL
Ketones, ur: 5 mg/dL — AB
Nitrite: NEGATIVE
Protein, ur: NEGATIVE mg/dL
Specific Gravity, Urine: 1.013 (ref 1.005–1.030)
WBC, UA: >50 WBC/hpf — ABNORMAL HIGH (ref 0–5)
pH: 6 (ref 5.0–8.0)

## 2018-10-21 LAB — PREGNANCY, URINE: Preg Test, Ur: NEGATIVE

## 2018-10-21 SURGERY — CYSTOSCOPY, WITH STENT INSERTION
Anesthesia: General | Site: Ureter | Laterality: Left

## 2018-10-21 MED ORDER — HYDROMORPHONE HCL 1 MG/ML IJ SOLN
0.5000 mg | INTRAMUSCULAR | Status: DC | PRN
  Administered 2018-10-21 (×2): 0.5 mg via INTRAVENOUS
  Filled 2018-10-21: qty 0.5

## 2018-10-21 MED ORDER — ONDANSETRON HCL 4 MG/2ML IJ SOLN
INTRAMUSCULAR | Status: DC | PRN
  Administered 2018-10-21: 4 mg via INTRAVENOUS

## 2018-10-21 MED ORDER — PROPOFOL 10 MG/ML IV BOLUS
INTRAVENOUS | Status: AC
  Filled 2018-10-21: qty 20

## 2018-10-21 MED ORDER — DEXAMETHASONE SODIUM PHOSPHATE 10 MG/ML IJ SOLN
INTRAMUSCULAR | Status: DC | PRN
  Administered 2018-10-21: 4 mg via INTRAVENOUS

## 2018-10-21 MED ORDER — SUCCINYLCHOLINE CHLORIDE 20 MG/ML IJ SOLN
INTRAMUSCULAR | Status: DC | PRN
  Administered 2018-10-21: 140 mg via INTRAVENOUS

## 2018-10-21 MED ORDER — OXYBUTYNIN CHLORIDE 5 MG PO TABS
5.0000 mg | ORAL_TABLET | Freq: Three times a day (TID) | ORAL | Status: DC | PRN
  Administered 2018-10-21: 23:00:00 5 mg via ORAL
  Filled 2018-10-21 (×2): qty 1

## 2018-10-21 MED ORDER — DIATRIZOATE MEGLUMINE 30 % UR SOLN
URETHRAL | Status: DC | PRN
  Administered 2018-10-21: 18:00:00 10 mL via URETHRAL

## 2018-10-21 MED ORDER — DIATRIZOATE MEGLUMINE 30 % UR SOLN
URETHRAL | Status: AC
  Filled 2018-10-21: qty 100

## 2018-10-21 MED ORDER — LACTATED RINGERS IV SOLN
INTRAVENOUS | Status: DC
  Administered 2018-10-21: 18:00:00 via INTRAVENOUS

## 2018-10-21 MED ORDER — HYDROMORPHONE HCL 1 MG/ML IJ SOLN
INTRAMUSCULAR | Status: AC
  Filled 2018-10-21: qty 0.5

## 2018-10-21 MED ORDER — OXYCODONE-ACETAMINOPHEN 5-325 MG PO TABS
2.0000 | ORAL_TABLET | Freq: Once | ORAL | Status: AC
  Administered 2018-10-21: 14:00:00 2 via ORAL
  Filled 2018-10-21: qty 2

## 2018-10-21 MED ORDER — SUGAMMADEX SODIUM 500 MG/5ML IV SOLN
INTRAVENOUS | Status: DC | PRN
  Administered 2018-10-21: 200 mg via INTRAVENOUS

## 2018-10-21 MED ORDER — HYDROCODONE-ACETAMINOPHEN 5-325 MG PO TABS
1.0000 | ORAL_TABLET | Freq: Four times a day (QID) | ORAL | Status: DC | PRN
  Administered 2018-10-21 – 2018-10-23 (×7): 1 via ORAL
  Filled 2018-10-21 (×7): qty 1

## 2018-10-21 MED ORDER — SODIUM CHLORIDE 0.9 % IV SOLN
1.0000 g | Freq: Once | INTRAVENOUS | Status: AC
  Administered 2018-10-21: 1 g via INTRAVENOUS
  Filled 2018-10-21: qty 10

## 2018-10-21 MED ORDER — SUCCINYLCHOLINE CHLORIDE 200 MG/10ML IV SOSY
PREFILLED_SYRINGE | INTRAVENOUS | Status: AC
  Filled 2018-10-21: qty 10

## 2018-10-21 MED ORDER — HYDROMORPHONE HCL 1 MG/ML IJ SOLN
0.5000 mg | Freq: Once | INTRAMUSCULAR | Status: AC
  Administered 2018-10-21: 16:00:00 0.5 mg via INTRAVENOUS
  Filled 2018-10-21: qty 1

## 2018-10-21 MED ORDER — FENTANYL CITRATE (PF) 250 MCG/5ML IJ SOLN
INTRAMUSCULAR | Status: AC
  Filled 2018-10-21: qty 5

## 2018-10-21 MED ORDER — ENSURE ENLIVE PO LIQD
237.0000 mL | Freq: Two times a day (BID) | ORAL | Status: DC
  Administered 2018-10-22: 09:00:00 237 mL via ORAL

## 2018-10-21 MED ORDER — PROPOFOL 10 MG/ML IV BOLUS
INTRAVENOUS | Status: DC | PRN
  Administered 2018-10-21: 200 mg via INTRAVENOUS

## 2018-10-21 MED ORDER — DIPHENHYDRAMINE HCL 25 MG PO CAPS
25.0000 mg | ORAL_CAPSULE | Freq: Four times a day (QID) | ORAL | Status: DC | PRN
  Administered 2018-10-21: 21:00:00 25 mg via ORAL
  Filled 2018-10-21: qty 1

## 2018-10-21 MED ORDER — ROCURONIUM BROMIDE 100 MG/10ML IV SOLN
INTRAVENOUS | Status: DC | PRN
  Administered 2018-10-21: 25 mg via INTRAVENOUS

## 2018-10-21 MED ORDER — ONDANSETRON HCL 4 MG/2ML IJ SOLN
4.0000 mg | Freq: Once | INTRAMUSCULAR | Status: AC
  Administered 2018-10-21: 16:00:00 4 mg via INTRAVENOUS
  Filled 2018-10-21: qty 2

## 2018-10-21 MED ORDER — ROCURONIUM BROMIDE 10 MG/ML (PF) SYRINGE
PREFILLED_SYRINGE | INTRAVENOUS | Status: AC
  Filled 2018-10-21: qty 10

## 2018-10-21 MED ORDER — FENTANYL CITRATE (PF) 100 MCG/2ML IJ SOLN
INTRAMUSCULAR | Status: DC | PRN
  Administered 2018-10-21 (×3): 50 ug via INTRAVENOUS

## 2018-10-21 MED ORDER — MIDAZOLAM HCL 2 MG/2ML IJ SOLN: 0.5000 mg | Freq: Once | INTRAMUSCULAR | Status: DC | PRN

## 2018-10-21 MED ORDER — POTASSIUM CHLORIDE IN NACL 40-0.9 MEQ/L-% IV SOLN
INTRAVENOUS | Status: AC
  Administered 2018-10-21 – 2018-10-22 (×2): 100 mL/h via INTRAVENOUS

## 2018-10-21 MED ORDER — SODIUM CHLORIDE 0.9 % IV SOLN
2.0000 g | INTRAVENOUS | Status: DC
  Administered 2018-10-22 – 2018-10-23 (×2): 2 g via INTRAVENOUS
  Filled 2018-10-21 (×2): qty 20

## 2018-10-21 MED ORDER — PROMETHAZINE HCL 25 MG/ML IJ SOLN
INTRAMUSCULAR | Status: AC
  Filled 2018-10-21: qty 1

## 2018-10-21 MED ORDER — KETOROLAC TROMETHAMINE 30 MG/ML IJ SOLN
30.0000 mg | Freq: Once | INTRAMUSCULAR | Status: DC
  Filled 2018-10-21: qty 1

## 2018-10-21 MED ORDER — SODIUM CHLORIDE 0.9 % IV SOLN: 2.0000 g | Freq: Once | INTRAVENOUS | Status: DC

## 2018-10-21 MED ORDER — PROMETHAZINE HCL 25 MG/ML IJ SOLN: 6.2500 mg | INTRAMUSCULAR | Status: DC | PRN

## 2018-10-21 MED ORDER — SODIUM CHLORIDE 0.9% FLUSH
INTRAVENOUS | Status: AC
  Filled 2018-10-21: qty 10

## 2018-10-21 MED ORDER — SUGAMMADEX SODIUM 500 MG/5ML IV SOLN
INTRAVENOUS | Status: AC
  Filled 2018-10-21: qty 5

## 2018-10-21 MED ORDER — SODIUM CHLORIDE 0.9 % IR SOLN
Status: DC | PRN
  Administered 2018-10-21: 3000 mL via INTRAVESICAL

## 2018-10-21 SURGICAL SUPPLY — 20 items
BAG DRAIN URO TABLE W/ADPT NS (BAG) ×3 IMPLANT
CATH INTERMIT  6FR 70CM (CATHETERS) ×3 IMPLANT
CLOTH BEACON ORANGE TIMEOUT ST (SAFETY) ×3 IMPLANT
DECANTER SPIKE VIAL GLASS SM (MISCELLANEOUS) ×3 IMPLANT
GLOVE BIOGEL M 6.5 STRL (GLOVE) ×3 IMPLANT
GLOVE BIOGEL M 8.0 STRL (GLOVE) ×3 IMPLANT
GLOVE BIOGEL PI IND STRL 6.5 (GLOVE) ×1 IMPLANT
GLOVE BIOGEL PI INDICATOR 6.5 (GLOVE) ×2
GOWN STRL REUS W/ TWL LRG LVL3 (GOWN DISPOSABLE) ×1 IMPLANT
GOWN STRL REUS W/TWL LRG LVL3 (GOWN DISPOSABLE) ×2
GOWN STRL REUS W/TWL XL LVL3 (GOWN DISPOSABLE) ×3 IMPLANT
GUIDEWIRE STR DUAL SENSOR (WIRE) IMPLANT
KIT TURNOVER CYSTO (KITS) ×3 IMPLANT
MANIFOLD NEPTUNE II (INSTRUMENTS) ×3 IMPLANT
NS IRRIG 500ML POUR BTL (IV SOLUTION) IMPLANT
PACK CYSTO (CUSTOM PROCEDURE TRAY) ×3 IMPLANT
PAD ARMBOARD 7.5X6 YLW CONV (MISCELLANEOUS) ×3 IMPLANT
STENT URET 6FRX24 CONTOUR (STENTS) ×3 IMPLANT
TOWEL OR 17X26 4PK STRL BLUE (TOWEL DISPOSABLE) ×3 IMPLANT
WATER STERILE IRR 3000ML UROMA (IV SOLUTION) ×3 IMPLANT

## 2018-10-21 NOTE — ED Notes (Signed)
Patient transported to CT 

## 2018-10-21 NOTE — Anesthesia Procedure Notes (Signed)
Procedure Name: Intubation Date/Time: 10/21/2018 5:42 PM Performed by: Lenice Llamas, MD Pre-anesthesia Checklist: Patient identified, Patient being monitored, Timeout performed, Emergency Drugs available and Suction available Patient Re-evaluated:Patient Re-evaluated prior to induction Oxygen Delivery Method: Circle System Utilized Preoxygenation: Pre-oxygenation with 100% oxygen Induction Type: IV induction and Rapid sequence Laryngoscope Size: Mac and 4 Grade View: Grade I Tube type: Oral Tube size: 7.0 mm Number of attempts: 1 Airway Equipment and Method: Stylet Placement Confirmation: ETT inserted through vocal cords under direct vision,  positive ETCO2 and breath sounds checked- equal and bilateral Secured at: 21 cm Tube secured with: Tape Dental Injury: Teeth and Oropharynx as per pre-operative assessment

## 2018-10-21 NOTE — Anesthesia Preprocedure Evaluation (Addendum)
Anesthesia Evaluation  Patient identified by MRN, date of birth, ID band Patient awake    Reviewed: Allergy & Precautions, NPO status , Patient's Chart, lab work & pertinent test results  Airway Mallampati: II  TM Distance: >3 FB Neck ROM: Full    Dental no notable dental hx. (+) Teeth Intact   Pulmonary neg pulmonary ROS,    Pulmonary exam normal breath sounds clear to auscultation       Cardiovascular Exercise Tolerance: Good negative cardio ROS Normal cardiovascular examI Rhythm:Regular Rate:Normal     Neuro/Psych negative neurological ROS  negative psych ROS   GI/Hepatic negative GI ROS, Neg liver ROS,   Endo/Other  Morbid obesity  Renal/GU Renal disease  negative genitourinary   Musculoskeletal negative musculoskeletal ROS (+)   Abdominal   Peds negative pediatric ROS (+)  Hematology negative hematology ROS (+)   Anesthesia Other Findings   Reproductive/Obstetrics negative OB ROS                             Anesthesia Physical Anesthesia Plan  ASA: III and emergent  Anesthesia Plan: General   Post-op Pain Management:    Induction:   PONV Risk Score and Plan:   Airway Management Planned: Oral ETT  Additional Equipment:   Intra-op Plan:   Post-operative Plan: Extubation in OR  Informed Consent:   Plan Discussed with:   Anesthesia Plan Comments: (Full PPE use planned )       Anesthesia Quick Evaluation

## 2018-10-21 NOTE — ED Provider Notes (Addendum)
[facility] EMERGENCY DEPARTMENT Provider Note   CSN: [id number] Arrival date & time: 10/21/18  1333    History   Chief Complaint Chief Complaint  Patient presents with  . Flank Pain    HPI [name] is a 33 y.o. female.     The history is provided by the patient. No language interpreter was used.  Flank Pain  This is a new problem. The current episode started 12 to 24 hours ago. The problem occurs constantly. The problem has been gradually worsening. Nothing aggravates the symptoms. Nothing relieves the symptoms. She has tried nothing for the symptoms. The treatment provided no relief.   Pt complains of pain in her left flank.  Pt went to urgent care and was told to come here.   I spoke to Dr. Retta Diones who advised pt may need a stent. He will see pt here  Past Medical History:  Diagnosis Date  . Back pain   . Renal disorder     There are no active problems to display for this patient.   Past Surgical History:  Procedure Laterality Date  . CYSTOSCOPY WITH RETROGRADE PYELOGRAM, URETEROSCOPY AND STENT PLACEMENT Left 10/17/2014   Procedure: CYSTOSCOPY AND URETERAL DILATION WITH LEFT RETROGRADE PYELOGRAM, URETEROSCOPY AND STENT PLACEMENT;  Surgeon: Ihor Gully, MD;  Location: [facility];  Service: Urology;  Laterality: Left;  . HOLMIUM LASER APPLICATION Left 10/17/2014   Procedure: HOLMIUM LASER LITHOTRIPSY ;  Surgeon: Ihor Gully, MD;  Location: [facility];  Service: Urology;  Laterality: Left;  . STONE EXTRACTION WITH BASKET    . TUBAL LIGATION       OB History   No obstetric history on file.      Home Medications    Prior to Admission medications   Medication Sig Start Date End Date Taking? Authorizing Provider  diphenhydrAMINE (BENADRYL) 25 MG tablet Take 50 mg by mouth at bedtime as needed for sleep.    [provider]  ibuprofen (ADVIL,MOTRIN) 800 MG tablet Take 1 tablet (800 mg total) by mouth every 8 (eight) hours  as needed for mild pain. 10/15/14   Ward, Layla Maw, DO  medroxyPROGESTERone (DEPO-PROVERA) 150 MG/ML injection Inject 1 mL into the muscle every 3 (three) months. 10/06/14   [provider]  ondansetron (ZOFRAN ODT) 4 MG disintegrating tablet Take 1 tablet (4 mg total) by mouth every 8 (eight) hours as needed for nausea or vomiting. 10/15/14   Ward, Layla Maw, DO  Oxycodone HCl 10 MG TABS Take 1 tablet (10 mg total) by mouth every 4 (four) hours as needed. 10/17/14   Ihor Gully, MD  oxyCODONE-acetaminophen (PERCOCET/ROXICET) 5-325 MG per tablet Take 1-2 tablets by mouth every 6 (six) hours as needed. 10/15/14   Ward, Layla Maw, DO  phenazopyridine (PYRIDIUM) 200 MG tablet Take 1 tablet (200 mg total) by mouth 3 (three) times daily as needed for pain. 10/17/14   Ihor Gully, MD  tamsulosin (FLOMAX) 0.4 MG CAPS capsule Take 1 capsule (0.4 mg total) by mouth daily. 10/15/14   Ward, Layla Maw, DO  promethazine (PHENERGAN) 25 MG tablet Take 1 tablet (25 mg total) by mouth every 6 (six) hours as needed for nausea. Patient not taking: Reported on 10/15/2014 09/17/11 10/15/14  Bethann Berkshire, MD    Family History History reviewed. No pertinent family history.  Social History Social History   Tobacco Use  . Smoking status: Never Smoker  . Smokeless tobacco: Never Used  Substance Use Topics  .  Alcohol use: No  . Drug use: No     Allergies   Vicodin [hydrocodone-acetaminophen]   Review of Systems Review of Systems  Genitourinary: Positive for flank pain.  All other systems reviewed and are negative.    Physical Exam Updated Vital Signs BP (!) 141/87 (BP Location: Right Arm)   Pulse (!) 128   Temp (!) 100.8 F (38.2 C) (Oral)   Ht 5\' 2"  (1.575 m)   Wt 103 kg   LMP 07/01/2014 Comment: neg preg  SpO2 100%   BMI 41.52 kg/m   Physical Exam Vitals signs reviewed.  HENT:     Head: Normocephalic.     Mouth/Throat:     Mouth: Mucous membranes are moist.  Neck:      Musculoskeletal: Normal range of motion.  Cardiovascular:     Rate and Rhythm: Normal rate.     Pulses: Normal pulses.  Pulmonary:     Effort: Pulmonary effort is normal.  Abdominal:     General: Abdomen is flat.     Comments: Tender left flank  Musculoskeletal: Normal range of motion.  Skin:    General: Skin is warm.  Neurological:     General: No focal deficit present.     Mental Status: She is alert.  Psychiatric:        Mood and Affect: Mood normal.      ED Treatments / Results  Labs (all labs ordered are listed, but only abnormal results are displayed) Labs Reviewed  URINALYSIS, ROUTINE W REFLEX MICROSCOPIC - Abnormal; Notable for the following components:      Result Value   APPearance HAZY (*)    Hgb urine dipstick SMALL (*)    Ketones, ur 5 (*)    Leukocytes,Ua LARGE (*)    WBC, UA >50 (*)    Bacteria, UA RARE (*)    All other components within normal limits  PREGNANCY, URINE    EKG None  Radiology No results found.  Procedures Procedures (including critical care time)  Medications Ordered in ED Medications  oxyCODONE-acetaminophen (PERCOCET/ROXICET) 5-325 MG per tablet 2 tablet (2 tablets Oral Given 10/21/18 1425)     Initial Impression / Assessment and Plan / ED Course  I have reviewed the triage vital signs and the nursing notes.  Pertinent labs & imaging results that were available during my care of the patient were reviewed by me and considered in my medical decision making (see chart for details).        MDM  Pt has had stones in the past.  Pt has been seen by Dr. Vernie Ammons in the past for stones.   Urine shows greater than 50 wbcs and many bacteria   Ct shows large   Final Clinical Impressions(s) / ED Diagnoses   Final diagnoses:  Left flank pain  Kidney stone  Hydronephrosis, unspecified hydronephrosis type    ED Discharge Orders    None       Elson Areas, [place] 10/21/18 1546    Elson Areas, PA-C 10/22/18 1545     Samuel Jester, DO 10/22/18 1923

## 2018-10-21 NOTE — ED Triage Notes (Signed)
Pt with left flank pain since last Sunday, seen at Urgent care and sent here due to kidney stones.  Blood was noted in urine per pt.

## 2018-10-21 NOTE — Transfer of Care (Signed)
Immediate Anesthesia Transfer of Care Note  Patient: [name]  Procedure(s) Performed: CYSTOSCOPY WITH STENT PLACEMENT (Left )  Patient Location: PACU  Anesthesia Type:General  Level of Consciousness: awake, alert  and oriented  Airway & Oxygen Therapy: Patient Spontanous Breathing  Post-op Assessment: Report given to RN  Post vital signs: Reviewed and stable  Last Vitals:  Vitals Value Taken Time  BP    Temp    Pulse    Resp    SpO2      Last Pain:  Vitals:   10/21/18 1350  TempSrc: Oral  PainSc:          Complications: No apparent anesthesia complications

## 2018-10-21 NOTE — Discharge Instructions (Signed)

## 2018-10-21 NOTE — Addendum Note (Signed)
Addendum  created 10/21/18 1822 by Dorena Cookey, MD   Intraprocedure Meds edited

## 2018-10-21 NOTE — Op Note (Signed)
Preoperative diagnosis: 21 mm obstructing left UPJ stone with UTI  Postoperative diagnosis: Same  Principal procedure: Cystoscopy, left retrograde ureteropyelogram, fluoroscopic interpretation, placement of 6 French by 24 cm contour double-J stent without tether  Surgeon: Nhyira Leano  Anesthesia: General  Complications: None  Specimens: None  Estimated blood loss: None  Indications: 33 year old female with urinary tract infection and fever as well as an obstructing 21 mm left UPJ stone with a subsequent 63mm left lower pole stone.  She presented to the emergency room today with the above findings.  Urologic consultation is requested.  Is a patient is febrile, has a UTI and this obstructing stone with associated hydronephrosis, I strongly recommended emergent decompression with double-J stent, IV antibiotic management as well as hospitalization.  The patient agrees with these and desires to proceed.  Findings: Urothelium of the bladder was normal in appearance except for patchy erythema consistent with urinary tract infection.  Ureteral orifice ease were normal in configuration and location.  Retrograde ureteropyelogram revealed a normal ureter with an obstructing large stone at the UPJ/renal pelvis.  No significant contrast got past this stone.  Description of procedure: The patient was properly identified and marked prior to the procedure.  She had received preoperative Rocephin.  She was taken to the operating room where general anesthetic was administered.  She was placed in the dorsolithotomy position.  Genitalia and perineum were prepped and draped.  Proper timeout was performed.  21 French panendoscope was advanced into her bladder with systematic inspection performed.  The left ureteral orifice was cannulated with a 6 [place] open-ended catheter with the above-mentioned retrograde ureteropyelogram performed with Omnipaque.  Following this, the sensor tip guidewire was advanced through the  open-ended catheter and with some difficulty was negotiated past the obstructing stone into the upper pole calyceal system.  Following this, the open-ended catheter was removed, and the 6 [place] by 24 cm contour double-J stent with a tether removed was easily advanced by the stone with the eventual curl of the proximal stent being just past the stone.  The distal curl was easily seen in the bladder after the guidewire was removed.  At this point the bladder was drained and the scope was subsequently removed.  The patient was then awakened and taken to the PACU in stable condition.  She tolerated the procedure well.

## 2018-10-21 NOTE — H&P (Signed)
History and Physical    [name] BSJ:[id number] DOB: Jan 26, 1986 DOA: 10/21/2018  PCP: Erasmo Downer, NP   Patient coming from: Home  Chief Complaint: Left flank pain  HPI: [name] is a 33 y.o. female with medical history significant for Obesity who presented to the ED with complaints of worsening left flank pain of one week duration. Patient was sent here from urgent care for kidney stone. She also reports sweats, chills.  She denies difficulty breathing, no cough or sore throat, no mayalgias or contact with sick persons. No travel.   ED Course: Initial tachycardia, 128, Temp 100.8. WBC- 14.9. Hgb 10.8. UA- Large Leuks, >50WBCs, rare bact.  ABD CT- left sided nephrolithiasis. 2.9 cm stone at the left UPJ causing mild to moderate grade obstruction.Slight increase in size of several left periaortic lymph nodes with the largest measuring 1.1 cm likely reactive. Urologist, Dr. Retta Diones was consulted in the Ed, recommended patient be taking to OR, for cystoscopy, retrograde and left double-J stent placement. IV ceftriazone started. Hospitalist to admit for UTI and renal stone.   Review of Systems: As per HPI all other systems reviewed and negative.  Past Medical History:  Diagnosis Date  . Back pain   . Renal disorder     Past Surgical History:  Procedure Laterality Date  . CYSTOSCOPY WITH RETROGRADE PYELOGRAM, URETEROSCOPY AND STENT PLACEMENT Left 10/17/2014   Procedure: CYSTOSCOPY AND URETERAL DILATION WITH LEFT RETROGRADE PYELOGRAM, URETEROSCOPY AND STENT PLACEMENT;  Surgeon: Ihor Gully, MD;  Location: [facility];  Service: Urology;  Laterality: Left;  . HOLMIUM LASER APPLICATION Left 10/17/2014   Procedure: HOLMIUM LASER LITHOTRIPSY ;  Surgeon: Ihor Gully, MD;  Location: [facility];  Service: Urology;  Laterality: Left;  . STONE EXTRACTION WITH BASKET    . TUBAL LIGATION       reports that she has never smoked. She has never used  smokeless tobacco. She reports that she does not drink alcohol or use drugs.  Allergies  Allergen Reactions  . Vicodin [Hydrocodone-Acetaminophen] Nausea And Vomiting    History reviewed. No pertinent family history.   Prior to Admission medications   Medication Sig Start Date End Date Taking? Authorizing Provider  diphenhydrAMINE (BENADRYL) 25 MG tablet Take 50 mg by mouth at bedtime as needed for sleep.    [provider]  ibuprofen (ADVIL,MOTRIN) 800 MG tablet Take 1 tablet (800 mg total) by mouth every 8 (eight) hours as needed for mild pain. 10/15/14   Ward, Layla Maw, DO  medroxyPROGESTERone (DEPO-PROVERA) 150 MG/ML injection Inject 1 mL into the muscle every 3 (three) months. 10/06/14   [provider]  ondansetron (ZOFRAN ODT) 4 MG disintegrating tablet Take 1 tablet (4 mg total) by mouth every 8 (eight) hours as needed for nausea or vomiting. 10/15/14   Ward, Layla Maw, DO  Oxycodone HCl 10 MG TABS Take 1 tablet (10 mg total) by mouth every 4 (four) hours as needed. 10/17/14   Ihor Gully, MD  oxyCODONE-acetaminophen (PERCOCET/ROXICET) 5-325 MG per tablet Take 1-2 tablets by mouth every 6 (six) hours as needed. 10/15/14   Ward, Layla Maw, DO  phenazopyridine (PYRIDIUM) 200 MG tablet Take 1 tablet (200 mg total) by mouth 3 (three) times daily as needed for pain. 10/17/14   Ihor Gully, MD  tamsulosin (FLOMAX) 0.4 MG CAPS capsule Take 1 capsule (0.4 mg total) by mouth daily. 10/15/14   Ward, Layla Maw, DO  promethazine (PHENERGAN) 25 MG tablet Take 1 tablet (25  mg total) by mouth every 6 (six) hours as needed for nausea. Patient not taking: Reported on 10/15/2014 09/17/11 10/15/14  Bethann Berkshire, MD    Physical Exam: Vitals:   10/21/18 1415 10/21/18 1527 10/21/18 1808 10/21/18 1912  BP:   103/66 125/75  Pulse: (!) 116  87 87  Resp:   18 18  Temp:  99.2 F (37.3 C) 97.6 F (36.4 C) 97.7 F (36.5 C)  TempSrc:    Oral  SpO2: 100%  100% 92%  Weight:      Height:         Constitutional: NAD, calm, comfortable Vitals:   10/21/18 1415 10/21/18 1527 10/21/18 1808 10/21/18 1912  BP:   103/66 125/75  Pulse: (!) 116  87 87  Resp:   18 18  Temp:  99.2 F (37.3 C) 97.6 F (36.4 C) 97.7 F (36.5 C)  TempSrc:    Oral  SpO2: 100%  100% 92%  Weight:      Height:       Eyes: PERRL, lids and conjunctivae normal ENMT: Mucous membranes are moist. Posterior pharynx clear of any exudate or lesions. Neck: normal, supple, no masses, no thyromegaly Respiratory: Normal respiratory effort. No accessory muscle use.  Cardiovascular: Regular rate and rhythm. No extremity edema. 2+ pedal pulses.   Abdomen: no tenderness, no masses palpated. No hepatosplenomegaly. Bowel sounds positive.  Musculoskeletal: no clubbing / cyanosis. No joint deformity upper and lower extremities. Good ROM, no contractures. Normal muscle tone.  Skin: no rashes, lesions, ulcers. No induration Neurologic: CN 2-12 grossly intact. Strength 5/5 in all 4.  Psychiatric: Normal judgment and insight. Alert and oriented x 3. Normal mood.   Labs on Admission: I have personally reviewed following labs and imaging studies  CBC: Recent Labs  Lab 10/21/18 1554  WBC 14.9*  NEUTROABS 11.9*  HGB 10.3*  HCT 32.3*  MCV 80.8  PLT 459*   Basic Metabolic Panel: Recent Labs  Lab 10/21/18 1554  NA 133*  K 3.5  CL 104  CO2 20*  GLUCOSE 92  BUN 9  CREATININE 0.99  CALCIUM 8.2*   Urine analysis:    Component Value Date/Time   COLORURINE YELLOW 10/21/2018 1351   APPEARANCEUR HAZY (A) 10/21/2018 1351   LABSPEC 1.013 10/21/2018 1351   PHURINE 6.0 10/21/2018 1351   GLUCOSEU NEGATIVE 10/21/2018 1351   HGBUR SMALL (A) 10/21/2018 1351   BILIRUBINUR NEGATIVE 10/21/2018 1351   KETONESUR 5 (A) 10/21/2018 1351   PROTEINUR NEGATIVE 10/21/2018 1351   UROBILINOGEN 0.2 10/15/2014 1454   NITRITE NEGATIVE 10/21/2018 1351   LEUKOCYTESUR LARGE (A) 10/21/2018 1351    Radiological Exams on Admission: Dg  C-arm 1-60 Min-no Report  Result Date: 10/21/2018 Fluoroscopy was utilized by the requesting physician.  No radiographic interpretation.   Ct Renal Stone Study  Result Date: 10/21/2018 CLINICAL DATA:  Left flank pain 1 week with hematuria. EXAM: CT ABDOMEN AND PELVIS WITHOUT CONTRAST TECHNIQUE: Multidetector CT imaging of the abdomen and pelvis was performed following the standard protocol without IV contrast. COMPARISON:  10/15/2014 FINDINGS: Lower chest: Lung bases are normal. Hepatobiliary: Liver, gallbladder and biliary tree are normal. Pancreas: Normal. Spleen: Normal. Adrenals/Urinary Tract: Adrenal glands are normal. Kidneys are normal in size. Mild-to-moderate left-sided hydronephrosis. Two stones over the lower pole left intrarenal collecting system with the larger measuring 1.5 cm. Large stone over the region of the left UPJ measuring 2.9 cm causing this obstruction. Minimal left perinephric inflammatory change. Remainder of the ureters and bladder are  normal. Stomach/Bowel: Stomach and small bowel are normal. Appendix is normal. Colon is normal. Vascular/Lymphatic: Vascular structures are within normal. Several small left periaortic with the largest measuring 1.1 cm by short axis increased in size compared to the previous exam and may be reactive. Reproductive: Previous hysterectomy.  Ovaries are normal. Other: No free fluid. Musculoskeletal: Unremarkable. IMPRESSION: Left-sided nephrolithiasis. 2.9 cm stone at the left UPJ causing mild to moderate grade obstruction. Slight increase in size of several left periaortic lymph nodes with the largest measuring 1.1 cm likely reactive. Electronically Signed   By: Elberta Fortis M.D.   On: 10/21/2018 15:06    EKG: None.    Assessment/Plan Principal Problem:   Left nephrolithiasis  UTI 2/2 Left nephrolitiasis with Sepsis- Left flank pain, fever- 100.8, tachycardia, leukocytosis- 14.9. UA suggestive of infection with Leuks, WBC. Abd CT- left  nephrolitiasis at UPJ junction with mid- mod hydrophrolitiasis. Taken to Or by Dr. Retta Diones- patient had Cystoscopy, left retrograde ureteropyelogram, fluoroscopic interpretation, placement of 6 French by 24 cm contour double-J stent without tether. Sepsis physiology resolved by the time of my evaluation, after surgery.  - Cont ceftriazone at 2g daily (adjusted for weight) - F/u Urine cultures - Blood cultures - IVF N/s + 40KCL 100cc/hr x 15hrs - BMP, CBC a.m - PRN hydrocodone-acetaminophen  HIV as part of routine health screen.   DVT prophylaxis: SCDS Code Status: Full Family Communication: None at bedside Disposition Plan: Per rounding team Consults called: Dr. Retta Diones Admission status: Obs., Tele.  Onnie Boer MD [facility]  10/21/2018, 8:28 PM

## 2018-10-21 NOTE — Progress Notes (Signed)
2013- patient in pain, hydrocodone ordered. Asked patient about allergy as noted in [facility], patient stated it was not really an allergy she "just has some nausea at times after taking it." Patient said she was alright with taking it.

## 2018-10-21 NOTE — Consult Note (Signed)
Urology Consult   Physician requesting consult: Samuel Jester, DO  Reason for consult: Infected stone  History of Present Illness: [name] is a 33 y.o. female with history of urolithiasis, previously followed by Dr. Vernie Ammons who presented to the emergency room today with about a weeks worth of left flank pain, shakes, sweats, getting worse with subsequent presentation today.  She has had no nausea or vomiting.  Pain has not been relieved by over-the-counter nonsteroidals.  In the emergency room she was found to have infected appearing urine as well as a large left UPJ/renal pelvic stone with hydronephrosis and other renal calculi.  Urologic consultation is requested.    Past Medical History:  Diagnosis Date  . Back pain   . Renal disorder     Past Surgical History:  Procedure Laterality Date  . CYSTOSCOPY WITH RETROGRADE PYELOGRAM, URETEROSCOPY AND STENT PLACEMENT Left 10/17/2014   Procedure: CYSTOSCOPY AND URETERAL DILATION WITH LEFT RETROGRADE PYELOGRAM, URETEROSCOPY AND STENT PLACEMENT;  Surgeon: Ihor Gully, MD;  Location: [facility];  Service: Urology;  Laterality: Left;  . HOLMIUM LASER APPLICATION Left 10/17/2014   Procedure: HOLMIUM LASER LITHOTRIPSY ;  Surgeon: Ihor Gully, MD;  Location: [facility];  Service: Urology;  Laterality: Left;  . STONE EXTRACTION WITH BASKET    . TUBAL LIGATION       Current Hospital Medications: Scheduled Meds: Continuous Infusions: PRN Meds:.  Allergies:  Allergies  Allergen Reactions  . Vicodin [Hydrocodone-Acetaminophen] Nausea And Vomiting    History reviewed. No pertinent family history.  Social History:  reports that she has never smoked. She has never used smokeless tobacco. She reports that she does not drink alcohol or use drugs.  ROS: A complete review of systems was performed.  All systems are negative except for pertinent findings as noted.  Physical Exam:  Vital signs in last 24  hours: Temp:  [99.2 F (37.3 C)-100.8 F (38.2 C)] 99.2 F (37.3 C) (04/05 1527) Pulse Rate:  [128] 128 (04/05 1350) BP: (141)/(87) 141/87 (04/05 1350) SpO2:  [100 %] 100 % (04/05 1350) Weight:  [103 kg] 103 kg (04/05 1348) General:  Alert and oriented, mild distress HEENT: Normocephalic, atraumatic Neck: No JVD or lymphadenopathy Cardiovascular: Regular rate Lungs: Normal inspiratory and expiratory excursion Abdomen: Soft, obese, no anterior abdominal tenderness but there is significant left CVA tenderness. Extremities: No edema Neurologic: Grossly intact  Laboratory Data:  Recent Labs    10/21/18 1554  WBC 14.9*  HGB 10.3*  HCT 32.3*  PLT 459*    Recent Labs    10/21/18 1554  NA 133*  K 3.5  CL 104  GLUCOSE 92  BUN 9  CALCIUM 8.2*  CREATININE 0.99     Results for orders placed or performed during the hospital encounter of 10/21/18 (from the past 24 hour(s))  Urinalysis, Routine w reflex microscopic     Status: Abnormal   Collection Time: 10/21/18  1:51 PM  Result Value Ref Range   Color, Urine YELLOW YELLOW   APPearance HAZY (A) CLEAR   Specific Gravity, Urine 1.013 1.005 - 1.030   pH 6.0 5.0 - 8.0   Glucose, UA NEGATIVE NEGATIVE mg/dL   Hgb urine dipstick SMALL (A) NEGATIVE   Bilirubin Urine NEGATIVE NEGATIVE   Ketones, ur 5 (A) NEGATIVE mg/dL   Protein, ur NEGATIVE NEGATIVE mg/dL   Nitrite NEGATIVE NEGATIVE   Leukocytes,Ua LARGE (A) NEGATIVE   RBC / HPF 11-20 0 - 5 RBC/hpf   WBC, UA >50 (  H) 0 - 5 WBC/hpf   Bacteria, UA RARE (A) NONE SEEN   Squamous Epithelial / LPF 0-5 0 - 5  Pregnancy, urine     Status: None   Collection Time: 10/21/18  1:51 PM  Result Value Ref Range   Preg Test, Ur NEGATIVE NEGATIVE  CBC with Differential/Platelet     Status: Abnormal   Collection Time: 10/21/18  3:54 PM  Result Value Ref Range   WBC 14.9 (H) 4.0 - 10.5 K/uL   RBC 4.00 3.87 - 5.11 MIL/uL   Hemoglobin 10.3 (L) 12.0 - 15.0 g/dL   HCT 38.2 (L) 50.5 - 39.7 %    MCV 80.8 80.0 - 100.0 fL   MCH 25.8 (L) 26.0 - 34.0 pg   MCHC 31.9 30.0 - 36.0 g/dL   RDW 67.3 41.9 - 37.9 %   Platelets 459 (H) 150 - 400 K/uL   nRBC 0.0 0.0 - 0.2 %   Neutrophils Relative % 80 %   Neutro Abs 11.9 (H) 1.7 - 7.7 K/uL   Lymphocytes Relative 9 %   Lymphs Abs 1.4 0.7 - 4.0 K/uL   Monocytes Relative 10 %   Monocytes Absolute 1.5 (H) 0.1 - 1.0 K/uL   Eosinophils Relative 0 %   Eosinophils Absolute 0.0 0.0 - 0.5 K/uL   Basophils Relative 0 %   Basophils Absolute 0.0 0.0 - 0.1 K/uL   Immature Granulocytes 1 %   Abs Immature Granulocytes 0.08 (H) 0.00 - 0.07 K/uL  Basic metabolic panel     Status: Abnormal   Collection Time: 10/21/18  3:54 PM  Result Value Ref Range   Sodium 133 (L) 135 - 145 mmol/L   Potassium 3.5 3.5 - 5.1 mmol/L   Chloride 104 98 - 111 mmol/L   CO2 20 (L) 22 - 32 mmol/L   Glucose, Bld 92 70 - 99 mg/dL   BUN 9 6 - 20 mg/dL   Creatinine, Ser 0.24 0.44 - 1.00 mg/dL   Calcium 8.2 (L) 8.9 - 10.3 mg/dL   GFR calc non Af Amer >60 >60 mL/min   GFR calc Af Amer >60 >60 mL/min   Anion gap 9 5 - 15   No results found for this or any previous visit (from the past 240 hour(s)).  Renal Function: Recent Labs    10/21/18 1554  CREATININE 0.99   Estimated Creatinine Clearance: 90.1 mL/min (by C-G formula based on SCr of 0.99 mg/dL).  Radiologic Imaging: Ct Renal Stone Study  Result Date: 10/21/2018 CLINICAL DATA:  Left flank pain 1 week with hematuria. EXAM: CT ABDOMEN AND PELVIS WITHOUT CONTRAST TECHNIQUE: Multidetector CT imaging of the abdomen and pelvis was performed following the standard protocol without IV contrast. COMPARISON:  10/15/2014 FINDINGS: Lower chest: Lung bases are normal. Hepatobiliary: Liver, gallbladder and biliary tree are normal. Pancreas: Normal. Spleen: Normal. Adrenals/Urinary Tract: Adrenal glands are normal. Kidneys are normal in size. Mild-to-moderate left-sided hydronephrosis. Two stones over the lower pole left intrarenal  collecting system with the larger measuring 1.5 cm. Large stone over the region of the left UPJ measuring 2.9 cm causing this obstruction. Minimal left perinephric inflammatory change. Remainder of the ureters and bladder are normal. Stomach/Bowel: Stomach and small bowel are normal. Appendix is normal. Colon is normal. Vascular/Lymphatic: Vascular structures are within normal. Several small left periaortic with the largest measuring 1.1 cm by short axis increased in size compared to the previous exam and may be reactive. Reproductive: Previous hysterectomy.  Ovaries are normal. Other: No  free fluid. Musculoskeletal: Unremarkable. IMPRESSION: Left-sided nephrolithiasis. 2.9 cm stone at the left UPJ causing mild to moderate grade obstruction. Slight increase in size of several left periaortic lymph nodes with the largest measuring 1.1 cm likely reactive. Electronically Signed   By: Elberta Fortis M.D.   On: 10/21/2018 15:06    I independently reviewed the above imaging studies.  Impression/Assessment:  Large (greater than 20 mm) left UPJ stone with additional left lower pole calculi and left hydronephrosis with urinary tract infection  Plan:  I have discussed urgent intervention with the patient, namely cystoscopy, retrograde and left double-J stent placement.  This will provide decompression of the left renal unit, allow her infection to be more adequately treated, and that she will eventually need percutaneous management of her large left renal stone burden.  The risks of the procedure including infection, ureteral injury, anesthetic complications have been discussed as well.  She desires to proceed.  I will speak with the hospitalist regarding admitting her for further management following left double-J stent placement.

## 2018-10-21 NOTE — Anesthesia Postprocedure Evaluation (Signed)
Anesthesia Post Note  Patient: [name]  Procedure(s) Performed: CYSTOSCOPY WITH STENT PLACEMENT (Left )  Patient location during evaluation: PACU Anesthesia Type: General Level of consciousness: awake and awake and alert Pain management: pain level controlled Vital Signs Assessment: post-procedure vital signs reviewed and stable Respiratory status: spontaneous breathing Cardiovascular status: blood pressure returned to baseline Postop Assessment: no headache Anesthetic complications: no     Last Vitals:  Vitals:   10/21/18 1415 10/21/18 1527  BP:    Pulse: (!) 116   Temp:  37.3 C  SpO2: 100%     Last Pain:  Vitals:   10/21/18 1350  TempSrc: Oral  PainSc:                  Dorena Cookey

## 2018-10-22 ENCOUNTER — Encounter (HOSPITAL_COMMUNITY): Payer: Self-pay | Admitting: Urology

## 2018-10-22 DIAGNOSIS — A419 Sepsis, unspecified organism: Secondary | ICD-10-CM | POA: Diagnosis not present

## 2018-10-22 DIAGNOSIS — N200 Calculus of kidney: Secondary | ICD-10-CM | POA: Diagnosis not present

## 2018-10-22 DIAGNOSIS — N390 Urinary tract infection, site not specified: Secondary | ICD-10-CM

## 2018-10-22 LAB — URINE CULTURE: Culture: 10000 — AB

## 2018-10-22 LAB — BASIC METABOLIC PANEL
Anion gap: 8 (ref 5–15)
BUN: 6 mg/dL (ref 6–20)
CO2: 23 mmol/L (ref 22–32)
Calcium: 8.1 mg/dL — ABNORMAL LOW (ref 8.9–10.3)
Chloride: 105 mmol/L (ref 98–111)
Creatinine, Ser: 0.86 mg/dL (ref 0.44–1.00)
GFR calc Af Amer: >60 mL/min (ref >60)
GFR calc non Af Amer: >60 mL/min (ref >60)
Glucose, Bld: 93 mg/dL (ref 70–99)
Potassium: 4.2 mmol/L (ref 3.5–5.1)
Sodium: 136 mmol/L (ref 135–145)

## 2018-10-22 LAB — CBC
HCT: 33.4 % — ABNORMAL LOW (ref 36.0–46.0)
Hemoglobin: 10.3 g/dL — ABNORMAL LOW (ref 12.0–15.0)
MCH: 25.9 pg — ABNORMAL LOW (ref 26.0–34.0)
MCHC: 30.8 g/dL (ref 30.0–36.0)
MCV: 83.9 fL (ref 80.0–100.0)
Platelets: 416 10*3/uL — ABNORMAL HIGH (ref 150–400)
RBC: 3.98 MIL/uL (ref 3.87–5.11)
RDW: 14.6 % (ref 11.5–15.5)
WBC: 11.3 10*3/uL — ABNORMAL HIGH (ref 4.0–10.5)
nRBC: 0 % (ref 0.0–0.2)

## 2018-10-22 MED ORDER — MAGIC MOUTHWASH W/LIDOCAINE: 5.0000 mL | Freq: Three times a day (TID) | ORAL | Status: DC

## 2018-10-22 MED ORDER — LIDOCAINE VISCOUS HCL 2 % MT SOLN
15.0000 mL | Freq: Three times a day (TID) | OROMUCOSAL | Status: DC
  Administered 2018-10-22 – 2018-10-23 (×5): 15 mL via OROMUCOSAL
  Filled 2018-10-22 (×5): qty 15

## 2018-10-22 MED ORDER — POTASSIUM CHLORIDE IN NACL 40-0.9 MEQ/L-% IV SOLN
INTRAVENOUS | Status: AC
  Administered 2018-10-22: 100 mL/h via INTRAVENOUS

## 2018-10-22 MED ORDER — PHENOL 1.4 % MT LIQD
1.0000 | OROMUCOSAL | Status: DC | PRN
  Administered 2018-10-22: 06:00:00 1 via OROMUCOSAL
  Filled 2018-10-22: qty 177

## 2018-10-22 MED ORDER — MAGIC MOUTHWASH
10.0000 mL | Freq: Three times a day (TID) | ORAL | Status: DC
  Administered 2018-10-22 – 2018-10-23 (×5): 10 mL via ORAL
  Filled 2018-10-22 (×5): qty 10

## 2018-10-22 NOTE — Progress Notes (Signed)
I appreciate the help of the hospital service/Dr.[email or web address] with the management of this patient.    She seems to be stable based on vital signs this morning.  I think it would be appropriate let her go home on oral antibiotics once we are sure that blood cultures are negative.  If she is still in the hospital tomorrow, I will be seeing her, as I have office in [place].  We will make sure that she has appropriate follow-up here in the office to discuss further management of her large stone burden.

## 2018-10-22 NOTE — Addendum Note (Signed)
Addendum  created 10/22/18 0768 by Dorena Cookey, MD   Clinical Note Signed, Intraprocedure Event edited, Intraprocedure Staff edited

## 2018-10-22 NOTE — Progress Notes (Signed)
PROGRESS NOTE    [name]  NTZ:[id number] DOB: 1986/03/17 DOA: 10/21/2018 PCP: Erasmo Downer, NP     Brief Narrative:  33-year-old female with a past medical history significant for morbid obesity; who presented to the emergency department with complaint of left flank pain and dysuria for 1 week.  Patient was found with sepsis due to UTI and nephrolithiasis.  Urology consulted, cultures taken patient started on IV antibiotics.  Taken to the OR for cystoscopy, retrograde ureteropyelogram and left double-J stent placement.   Assessment & Plan: 1-sepsis secondary to UTI and left nephrolithiasis  -Patient is spiked fever overnight -Status post cystoscopy with ureteral pyelogram and placement of double-J stent. -Continue treatment with IV Rocephin -Cultures pending -Continue IV fluids -Continue as needed antipyretics. -WBCs close to normal and no longer tachycardic -Anticipate discharge home in the next 24-48 hours; after she has demonstrated no fever for 24 hours and having availability of sensitivity to tailor outpatient oral regimen. -She will continue to follow closely by urology service to receive definitive treatment for ongoing nephrolithiasis.  2-oral ulcers -Appears to be aphthous ulcers -No thrush -We will use Magic mouthwash.  3-morbid obesity -Body mass index is 42.06 kg/m. -Low calorie diet, portion control and increase physical activity discussed with patient.  DVT prophylaxis: SCDs Code Status: Full code Family Communication: No family at bedside. Disposition Plan: Continue IV antibiotics, follow urine culture results and blood culture results.  Home once afebrile for over 24 hours and with sensitivity back.  Consultants:   Urology service.  Procedures:   Status post cystoscopy and/double-J stent placement.  Antimicrobials:  Anti-infectives (From admission, onward)   Start     Dose/Rate Route Frequency Ordered Stop   10/22/18 1600  cefTRIAXone  (ROCEPHIN) 2 g in sodium chloride 0.9 % 100 mL IVPB     2 g 200 mL/hr over 30 Minutes Intravenous Every 24 hours 10/21/18 1941     10/22/18 1500  cefTRIAXone (ROCEPHIN) 2 g in sodium chloride 0.9 % 100 mL IVPB  Status:  Discontinued     2 g 200 mL/hr over 30 Minutes Intravenous  Once 10/21/18 1938 10/21/18 1941   10/21/18 2000  cefTRIAXone (ROCEPHIN) 1 g in sodium chloride 0.9 % 100 mL IVPB     1 g 200 mL/hr over 30 Minutes Intravenous  Once 10/21/18 1938 10/21/18 2053   10/21/18 1545  cefTRIAXone (ROCEPHIN) 1 g in sodium chloride 0.9 % 100 mL IVPB     1 g 200 mL/hr over 30 Minutes Intravenous  Once 10/21/18 1543 10/21/18 1627       Subjective: Low-grade fever overnight, continued to have intermittent chills.  No nausea or vomiting today.  Overall feeling better.  Objective: Vitals:   10/21/18 1900 10/21/18 1912 10/21/18 2157 10/22/18 0512  BP: 118/78 125/75 113/74 (!) 101/51  Pulse: 85 87 86 92  Resp: 18 18 18 19   Temp: 97.8 F (36.6 C) 97.7 F (36.5 C) (!) 97.5 F (36.4 C) 98.8 F (37.1 C)  TempSrc:  Oral Oral Oral  SpO2: 97% 92% 100% 99%  Weight:  104.3 kg    Height:  5\' 2"  (1.575 m)      Intake/Output Summary (Last 24 hours) at 10/22/2018 1146 Last data filed at 10/22/2018 1124 Gross per 24 hour  Intake 2667.24 ml  Output 3600 ml  Net -932.76 ml   Filed Weights   10/21/18 1348 10/21/18 1912  Weight: 103 kg 104.3 kg    Examination: General exam: Alert, awake, oriented  x 3; no chest pain, no shortness of breath, reports no nausea or vomiting currently.  Patient complaining of oral ulcers and continued to have intermittent episode of chills.  Positive fever overnight. Respiratory system: Clear to auscultation. Respiratory effort normal. Cardiovascular system:RRR. No murmurs, rubs, gallops. Gastrointestinal system: Abdomen is nondistended, soft and nontender. No organomegaly or masses felt. Normal bowel sounds heard. Central nervous system: Alert and oriented. No  focal neurological deficits. Extremities: No C/C/E, +pedal pulses Skin: No rashes, lesions or ulcers Psychiatry: Judgement and insight appear normal. Mood & affect appropriate.     Data Reviewed: I have personally reviewed following labs and imaging studies  CBC: Recent Labs  Lab 10/21/18 1554 10/22/18 0501  WBC 14.9* 11.3*  NEUTROABS 11.9*  --   HGB 10.3* 10.3*  HCT 32.3* 33.4*  MCV 80.8 83.9  PLT 459* 416*   Basic Metabolic Panel: Recent Labs  Lab 10/21/18 1554 10/22/18 0501  NA 133* 136  K 3.5 4.2  CL 104 105  CO2 20* 23  GLUCOSE 92 93  BUN 9 6  CREATININE 0.99 0.86  CALCIUM 8.2* 8.1*   GFR: Estimated Creatinine Clearance: 104.5 mL/min (by C-G formula based on SCr of 0.86 mg/dL).  Urine analysis:    Component Value Date/Time   COLORURINE YELLOW 10/21/2018 1351   APPEARANCEUR HAZY (A) 10/21/2018 1351   LABSPEC 1.013 10/21/2018 1351   PHURINE 6.0 10/21/2018 1351   GLUCOSEU NEGATIVE 10/21/2018 1351   HGBUR SMALL (A) 10/21/2018 1351   BILIRUBINUR NEGATIVE 10/21/2018 1351   KETONESUR 5 (A) 10/21/2018 1351   PROTEINUR NEGATIVE 10/21/2018 1351   UROBILINOGEN 0.2 10/15/2014 1454   NITRITE NEGATIVE 10/21/2018 1351   LEUKOCYTESUR LARGE (A) 10/21/2018 1351    Recent Results (from the past 240 hour(s))  Culture, blood (routine x 2)     Status: None (Preliminary result)   Collection Time: 10/21/18 10:48 PM  Result Value Ref Range Status   Specimen Description BLOOD RIGHT ARM  Final   Special Requests   Final    BOTTLES DRAWN AEROBIC AND ANAEROBIC Blood Culture adequate volume   Culture   Final    NO GROWTH < 12 HOURS Performed at [facility], [address] [place], [place] [address]    Report Status PENDING  Incomplete  Culture, blood (routine x 2)     Status: None (Preliminary result)   Collection Time: 10/21/18 11:08 PM  Result Value Ref Range Status   Specimen Description BLOOD RIGHT FOREARM  Final   Special Requests   Final    BOTTLES DRAWN AEROBIC  AND ANAEROBIC Blood Culture adequate volume   Culture   Final    NO GROWTH < 12 HOURS Performed at [facility], [address], [place], [place] [address]    Report Status PENDING  Incomplete     Radiology Studies: Dg C-arm 1-60 Min-no Report  Result Date: 10/21/2018 Fluoroscopy was utilized by the requesting physician.  No radiographic interpretation.   Ct Renal Stone Study  Result Date: 10/21/2018 CLINICAL DATA:  Left flank pain 1 week with hematuria. EXAM: CT ABDOMEN AND PELVIS WITHOUT CONTRAST TECHNIQUE: Multidetector CT imaging of the abdomen and pelvis was performed following the standard protocol without IV contrast. COMPARISON:  10/15/2014 FINDINGS: Lower chest: Lung bases are normal. Hepatobiliary: Liver, gallbladder and biliary tree are normal. Pancreas: Normal. Spleen: Normal. Adrenals/Urinary Tract: Adrenal glands are normal. Kidneys are normal in size. Mild-to-moderate left-sided hydronephrosis. Two stones over the lower pole left intrarenal collecting system with the  larger measuring 1.5 cm. Large stone over the region of the left UPJ measuring 2.9 cm causing this obstruction. Minimal left perinephric inflammatory change. Remainder of the ureters and bladder are normal. Stomach/Bowel: Stomach and small bowel are normal. Appendix is normal. Colon is normal. Vascular/Lymphatic: Vascular structures are within normal. Several small left periaortic with the largest measuring 1.1 cm by short axis increased in size compared to the previous exam and may be reactive. Reproductive: Previous hysterectomy.  Ovaries are normal. Other: No free fluid. Musculoskeletal: Unremarkable. IMPRESSION: Left-sided nephrolithiasis. 2.9 cm stone at the left UPJ causing mild to moderate grade obstruction. Slight increase in size of several left periaortic lymph nodes with the largest measuring 1.1 cm likely reactive. Electronically Signed   By: Elberta Fortisaniel  Boyle M.D.   On: 10/21/2018 15:06     Scheduled Meds: .  magic mouthwash w/lidocaine  5 mL Oral TID   Continuous Infusions: . 0.9 % NaCl with KCl 40 mEq / L    . cefTRIAXone (ROCEPHIN)  IV       LOS: 0 days    Time spent: 30 minutes   Vassie Lollarlos Liesa Tsan, MD [facility] Pager [phone]   10/22/2018, 11:46 AM

## 2018-10-22 NOTE — Progress Notes (Signed)
Nutrition Brief Note RD working remotely.  Patient identified on the Malnutrition Screening Tool (MST) Report  Wt Readings from Last 15 Encounters:  10/21/18 104.3 kg  10/17/14 103 kg  10/15/14 103.4 kg  02/14/12 97.5 kg  11/26/11 99.3 kg  11/11/11 98.9 kg  09/17/11 93 kg   [name] is a 33 y.o. female with history of urolithiasis, previously followed by Dr. Vernie Ammons who presented to the emergency room today with about a weeks worth of left flank pain, shakes, sweats, getting worse with subsequent presentation today.  She has had no nausea or vomiting.  Pain has not been relieved by over-the-counter nonsteroidals.  In the emergency room she was found to have infected appearing urine as well as a large left UPJ/renal pelvic stone with hydronephrosis and other renal calculi.    Pt admitted with obstructing lt UPJ stone with UTI.   4/5- s/p procedure: Cystoscopy, left retrograde ureteropyelogram, fluoroscopic interpretation, placement of 6 French by 24 cm contour double-J stent without tether  Attempted to speak with pt by phone, however, phone message reports pt unavailable to take call at this time  Reviewed I/O's: -478 ml x 24 hours  UOP: 2.1 L x 24 hours  Per MST score report, pt has eliminated tea and soda from her diet. Per wt hx, wt has been stable over the past several years. Pt ate 75% of her breakfast this morning and consumed AM dose of Ensure. RD will d/c Ensure secondary to stable weight and good oral intake.   Labs reviewed.   Body mass index is 42.06 kg/m. Patient meets criteria for extreme obesity, class III based on current BMI.   Current diet order is regular, patient is consuming approximately 75% of meals at this time. Labs and medications reviewed.   No nutrition interventions warranted at this time. If nutrition issues arise, please consult RD.   Lekeya Rollings A. Mayford Knife, RD, LDN, CDCES Registered Dietitian II Certified Diabetes Care and Education  Specialist Pager: [phone] After hours Pager: [phone]

## 2018-10-23 DIAGNOSIS — A419 Sepsis, unspecified organism: Secondary | ICD-10-CM | POA: Diagnosis not present

## 2018-10-23 DIAGNOSIS — N200 Calculus of kidney: Secondary | ICD-10-CM | POA: Diagnosis not present

## 2018-10-23 DIAGNOSIS — N1330 Unspecified hydronephrosis: Secondary | ICD-10-CM | POA: Diagnosis not present

## 2018-10-23 DIAGNOSIS — N390 Urinary tract infection, site not specified: Secondary | ICD-10-CM

## 2018-10-23 DIAGNOSIS — R109 Unspecified abdominal pain: Secondary | ICD-10-CM

## 2018-10-23 LAB — CBC
HCT: 30.2 % — ABNORMAL LOW (ref 36.0–46.0)
Hemoglobin: 9.5 g/dL — ABNORMAL LOW (ref 12.0–15.0)
MCH: 25.9 pg — ABNORMAL LOW (ref 26.0–34.0)
MCHC: 31.5 g/dL (ref 30.0–36.0)
MCV: 82.3 fL (ref 80.0–100.0)
Platelets: 505 10*3/uL — ABNORMAL HIGH (ref 150–400)
RBC: 3.67 MIL/uL — ABNORMAL LOW (ref 3.87–5.11)
RDW: 14.5 % (ref 11.5–15.5)
WBC: 8.4 10*3/uL (ref 4.0–10.5)
nRBC: 0 % (ref 0.0–0.2)

## 2018-10-23 LAB — BASIC METABOLIC PANEL
Anion gap: 7 (ref 5–15)
BUN: 6 mg/dL (ref 6–20)
CO2: 25 mmol/L (ref 22–32)
Calcium: 8.4 mg/dL — ABNORMAL LOW (ref 8.9–10.3)
Chloride: 107 mmol/L (ref 98–111)
Creatinine, Ser: 0.81 mg/dL (ref 0.44–1.00)
GFR calc Af Amer: >60 mL/min (ref >60)
GFR calc non Af Amer: >60 mL/min (ref >60)
Glucose, Bld: 85 mg/dL (ref 70–99)
Potassium: 4.7 mmol/L (ref 3.5–5.1)
Sodium: 139 mmol/L (ref 135–145)

## 2018-10-23 LAB — HIV ANTIBODY (ROUTINE TESTING W REFLEX): HIV Screen 4th Generation wRfx: NONREACTIVE

## 2018-10-23 MED ORDER — MORPHINE SULFATE (PF) 2 MG/ML IV SOLN
1.0000 mg | Freq: Once | INTRAVENOUS | Status: AC
  Administered 2018-10-23: 10:00:00 1 mg via INTRAVENOUS
  Filled 2018-10-23: qty 1

## 2018-10-23 MED ORDER — SULFAMETHOXAZOLE-TRIMETHOPRIM 800-160 MG PO TABS: 1.0000 | ORAL_TABLET | Freq: Two times a day (BID) | ORAL | 0 refills | Status: AC

## 2018-10-23 MED ORDER — OXYBUTYNIN CHLORIDE 5 MG PO TABS: 5.0000 mg | ORAL_TABLET | Freq: Three times a day (TID) | ORAL | 1 refills | Status: DC | PRN

## 2018-10-23 MED ORDER — HYDROCODONE-ACETAMINOPHEN 5-325 MG PO TABS: 2.0000 | ORAL_TABLET | Freq: Four times a day (QID) | ORAL | Status: DC | PRN

## 2018-10-23 MED ORDER — ONDANSETRON 8 MG PO TBDP: 8.0000 mg | ORAL_TABLET | Freq: Three times a day (TID) | ORAL | 0 refills | Status: DC | PRN

## 2018-10-23 MED ORDER — HYDROCODONE-ACETAMINOPHEN 5-325 MG PO TABS: 2.0000 | ORAL_TABLET | Freq: Four times a day (QID) | ORAL | 0 refills | Status: AC | PRN

## 2018-10-23 NOTE — Progress Notes (Signed)
2 Days Post-Op Subjective: Patient reports feeling chills but improving  Objective: Vital signs in last 24 hours: Temp:  [98 F (36.7 C)-98.3 F (36.8 C)] 98 F (36.7 C) (04/07 0533) Pulse Rate:  [87-96] 87 (04/07 0533) Resp:  [16-20] 16 (04/07 0533) BP: (105-115)/(68-72) 115/72 (04/07 0533) SpO2:  [98 %-99 %] 98 % (04/07 0533)  Intake/Output from previous day: 04/06 0701 - 04/07 0700 In: 3699.9 [P.O.:1200; I.V.:2399.9; IV Piggyback:100] Out: 6425 [Urine:6425] Intake/Output this shift: Total I/O In: 360 [P.O.:360] Out: 450 [Urine:450]  Physical Exam:  Constitutional: Vital signs reviewed. WD WN in NAD   Eyes: PERRL, No scleral icterus.   Cardiovascular: RRR Pulmonary/Chest: Normal effort Extremities: No cyanosis or edema   Lab Results: Recent Labs    10/21/18 1554 10/22/18 0501 10/23/18 0511  HGB 10.3* 10.3* 9.5*  HCT 32.3* 33.4* 30.2*   BMET Recent Labs    10/22/18 0501 10/23/18 0511  NA 136 139  K 4.2 4.7  CL 105 107  CO2 23 25  GLUCOSE 93 85  BUN 6 6  CREATININE 0.86 0.81  CALCIUM 8.1* 8.4*   No results for input(s): LABPT, INR in the last 72 hours. No results for input(s): LABURIN in the last 72 hours. Results for orders placed or performed during the hospital encounter of 10/21/18  Urine Culture     Status: Abnormal   Collection Time: 10/21/18  1:51 PM  Result Value Ref Range Status   Specimen Description   Final    URINE, CLEAN CATCH Performed at [facility], [address] [place], [place] [address]    Special Requests   Final    NONE Performed at [facility], [address] [place], [place] [address]    Culture (A)  Final    10,000 COLONIES/mL MULTIPLE SPECIES PRESENT, SUGGEST RECOLLECTION   Report Status 10/22/2018 FINAL  Final  Culture, blood (routine x 2)     Status: None (Preliminary result)   Collection Time: 10/21/18 10:48 PM  Result Value Ref Range Status   Specimen Description BLOOD RIGHT ARM  Final   Special Requests    Final    BOTTLES DRAWN AEROBIC AND ANAEROBIC Blood Culture adequate volume   Culture   Final    NO GROWTH 2 DAYS Performed at [facility], [address] [place], [place] [address]    Report Status PENDING  Incomplete  Culture, blood (routine x 2)     Status: None (Preliminary result)   Collection Time: 10/21/18 11:08 PM  Result Value Ref Range Status   Specimen Description BLOOD RIGHT FOREARM  Final   Special Requests   Final    BOTTLES DRAWN AEROBIC AND ANAEROBIC Blood Culture adequate volume   Culture   Final    NO GROWTH 2 DAYS Performed at [facility], [address] [place], [place] [address]    Report Status PENDING  Incomplete    Studies/Results: Dg C-arm 1-60 Min-no Report  Result Date: 10/21/2018 Fluoroscopy was utilized by the requesting physician.  No radiographic interpretation.   Ct Renal Stone Study  Result Date: 10/21/2018 CLINICAL DATA:  Left flank pain 1 week with hematuria. EXAM: CT ABDOMEN AND PELVIS WITHOUT CONTRAST TECHNIQUE: Multidetector CT imaging of the abdomen and pelvis was performed following the standard protocol without IV contrast. COMPARISON:  10/15/2014 FINDINGS: Lower chest: Lung bases are normal. Hepatobiliary: Liver, gallbladder and biliary tree are normal. Pancreas: Normal. Spleen: Normal. Adrenals/Urinary Tract: Adrenal glands are normal. Kidneys are normal in size. Mild-to-moderate left-sided hydronephrosis.  Two stones over the lower pole left intrarenal collecting system with the larger measuring 1.5 cm. Large stone over the region of the left UPJ measuring 2.9 cm causing this obstruction. Minimal left perinephric inflammatory change. Remainder of the ureters and bladder are normal. Stomach/Bowel: Stomach and small bowel are normal. Appendix is normal. Colon is normal. Vascular/Lymphatic: Vascular structures are within normal. Several small left periaortic with the largest measuring 1.1 cm by short axis increased in size compared to the previous  exam and may be reactive. Reproductive: Previous hysterectomy.  Ovaries are normal. Other: No free fluid. Musculoskeletal: Unremarkable. IMPRESSION: Left-sided nephrolithiasis. 2.9 cm stone at the left UPJ causing mild to moderate grade obstruction. Slight increase in size of several left periaortic lymph nodes with the largest measuring 1.1 cm likely reactive. Electronically Signed   By: Elberta Fortisaniel  Boyle M.D.   On: 10/21/2018 15:06    Assessment/Plan:   POD 2 left ureteral stenting for obstructing stone w/ fever. Blood cultures negative, urine culture mixed growth.   I think from GU standpoint d/c is fine. I would recommend 10 days of po abx (septra or keflex) as well as oxybutynin prn  We'll call her to schedule f/u  Thanks so much for covering   LOS: 0 days   Chelsea AusStephen M Yonna Alwin 10/23/2018, 12:29 PM

## 2018-10-23 NOTE — Progress Notes (Signed)
Nsg Discharge Note  Admit Date:  10/21/2018 Discharge date: 10/23/2018   Nariya Wandrey to be D/C'd Home per MD order.  AVS completed.  Copy for chart, and copy for patient signed, and dated. Patient/caregiver able to verbalize understanding.  Discharge Medication: Allergies as of 10/23/2018   No Known Allergies     Medication List    STOP taking these medications   ibuprofen 800 MG tablet Commonly known as:  ADVIL,MOTRIN     TAKE these medications   diphenhydrAMINE 25 MG tablet Commonly known as:  BENADRYL Take 50 mg by mouth at bedtime as needed for sleep.   HYDROcodone-acetaminophen 5-325 MG tablet Commonly known as:  NORCO/VICODIN Take 2 tablets by mouth every 6 (six) hours as needed for up to 7 days for severe pain.   ondansetron 8 MG disintegrating tablet Commonly known as:  Zofran ODT Take 1 tablet (8 mg total) by mouth every 8 (eight) hours as needed for nausea or vomiting.   oxybutynin 5 MG tablet Commonly known as:  DITROPAN Take 1 tablet (5 mg total) by mouth every 8 (eight) hours as needed for bladder spasms.   sulfamethoxazole-trimethoprim 800-160 MG tablet Commonly known as:  BACTRIM DS,SEPTRA DS Take 1 tablet by mouth 2 (two) times daily for 10 days.   Vitamin D (Cholecalciferol) 50 MCG (2000 UT) Caps Take 3 capsules by mouth every other day.       Discharge Assessment: Vitals:   10/23/18 0533 10/23/18 1333  BP: 115/72 130/84  Pulse: 87 93  Resp: 16 18  Temp: 98 F (36.7 C) 98.6 F (37 C)  SpO2: 98% 98%   Skin clean, dry and intact without evidence of skin break down, no evidence of skin tears noted. IV catheter discontinued intact. Site without signs and symptoms of complications - no redness or edema noted at insertion site, patient denies c/o pain - only slight tenderness at site.  Dressing with slight pressure applied.  D/c Instructions-Education: Discharge instructions given to patient/family with verbalized understanding. D/c education  completed with patient/family including follow up instructions, medication list, d/c activities limitations if indicated, with other d/c instructions as indicated by MD - patient able to verbalize understanding, all questions fully answered. Patient instructed to return to ED, call 911, or call MD for any changes in condition.  Patient escorted via WC, and D/C home via private auto.  Andria Rhein, RN 10/23/2018 3:37 PM

## 2018-10-23 NOTE — Discharge Summary (Signed)
Physician Discharge Summary  [name] JZP:[id number] DOB: [birth year] DOA: 10/21/2018  PCP: Erasmo Downer, NP  Admit date: 10/21/2018 Discharge date: 10/23/2018  Time spent: 35 minutes  Recommendations for Outpatient Follow-up:  1. Repeat basic metabolic panel to follow electrolytes and renal function 2. Continue assisting with weight loss process 3. Follow final blood culture results.   Discharge Diagnoses:  Principal Problem:   Left nephrolithiasis Active Problems:   Hydronephrosis   Left flank pain   Acute lower UTI   Obesity, Class III, BMI 40-49.9 (morbid obesity) (HCC)   Discharge Condition: Stable and improved.  Patient discharged home with instruction to follow-up with PCP in 10 days.  She will follow-up with urology service for definitive treatment of nephrolithiasis as an outpatient.  Diet recommendation: Low calorie diet  Filed Weights   10/21/18 1348 10/21/18 1912  Weight: 103 kg 104.3 kg    Brief history of present illness:  As per H&P written by Dr. Mariea Clonts on 10/21/2018 33 year old female with a past medical history significant for morbid obesity; who presented to the emergency department with complaint of left flank pain and dysuria for 1 week.  Patient was found with sepsis due to UTI and nephrolithiasis.  Urology consulted, cultures taken patient started on IV antibiotics.  Taken to the OR for cystoscopy, retrograde ureteropyelogram and left double-J stent placement.  Hospital Course:  1-sepsis secondary to UTI and left nephrolithiasis  -Patient is spiked fever overnight -Status post cystoscopy with ureteral pyelogram and placement of double-J stent. -Advised to maintain adequate hydration. -Continue as needed antiemetics. -WBCs back to normal and patient afebrile -Sepsis features resolved prior to discharge -Patient remains afebrile for over 24 hours. -Outpatient follow-up with urology service to receive definitive treatment for ongoing  nephrolithiasis. -Patient has been discharged on Bactrim twice a day for 10 days to complete antibiotic therapy. -No growth on blood cultures by time of discharge.    2-oral ulcers -Appears to be aphthous ulcers -No thrush -Magic mouthwashUsed while inpatient; with good results. -Advised to use OTC peroxyl by [employer]  3-morbid obesity -Body mass index is 42.06 kg/m. -Low calorie diet, portion control and increase physical activity discussed with patient.  Procedures: Status post cystoscopy and double-J stent placement.  (10/21/2018)  Consultations:  Urology service  Discharge Exam: Vitals:   10/23/18 0533 10/23/18 1333  BP: 115/72 130/84  Pulse: 87 93  Resp: 16 18  Temp: 98 F (36.7 C) 98.6 F (37 C)  SpO2: 98% 98%    General: Afebrile, no chest pain, no shortness of breath, no nausea, no vomiting. Cardiovascular: S1 and S2, no rubs, no gallops, no murmurs. Respiratory: Clear to auscultation bilaterally Abdomen: Still complaining of left flank discomfort; positive bowel sounds, no guarding. Extremities: No edema, no cyanosis, no clubbing.  Discharge Instructions   Discharge Instructions    Diet - low sodium heart healthy   Complete by:  As directed    Discharge instructions   Complete by:  As directed    Keep yourself well-hydrated Arrange follow-up with PCP in 10 days Follow-up with urology service as instructed Take antibiotics as prescribed (make sure to take them along with food to minimize GI symptoms)     Allergies as of 10/23/2018   No Known Allergies     Medication List    STOP taking these medications   ibuprofen 800 MG tablet Commonly known as:  ADVIL,MOTRIN     TAKE these medications   diphenhydrAMINE 25 MG tablet Commonly known as:  BENADRYL Take 50 mg by mouth at bedtime as needed for sleep.   HYDROcodone-acetaminophen 5-325 MG tablet Commonly known as:  NORCO/VICODIN Take 2 tablets by mouth every 6 (six) hours as needed for up to 7  days for severe pain.   ondansetron 8 MG disintegrating tablet Commonly known as:  Zofran ODT Take 1 tablet (8 mg total) by mouth every 8 (eight) hours as needed for nausea or vomiting.   oxybutynin 5 MG tablet Commonly known as:  DITROPAN Take 1 tablet (5 mg total) by mouth every 8 (eight) hours as needed for bladder spasms.   sulfamethoxazole-trimethoprim 800-160 MG tablet Commonly known as:  BACTRIM DS,SEPTRA DS Take 1 tablet by mouth 2 (two) times daily for 10 days.   Vitamin D (Cholecalciferol) 50 MCG (2000 UT) Caps Take 3 capsules by mouth every other day.      No Known Allergies Follow-up Information    Ihor Gully, MD.   Specialty:  Urology Why:  After discharge, call our office to schedule follow-up. Contact information: [address] [place] [address] [phone]        Erasmo Downer, NP. Schedule an appointment as soon as possible for a visit in 10 day(s).   Specialty:  Nurse Practitioner Contact information: [address] [place] [address] [phone]           The results of significant diagnostics from this hospitalization (including imaging, microbiology, ancillary and laboratory) are listed below for reference.    Significant Diagnostic Studies: Dg C-arm 1-60 Min-no Report  Result Date: 10/21/2018 Fluoroscopy was utilized by the requesting physician.  No radiographic interpretation.   Ct Renal Stone Study  Result Date: 10/21/2018 CLINICAL DATA:  Left flank pain 1 week with hematuria. EXAM: CT ABDOMEN AND PELVIS WITHOUT CONTRAST TECHNIQUE: Multidetector CT imaging of the abdomen and pelvis was performed following the standard protocol without IV contrast. COMPARISON:  10/15/2014 FINDINGS: Lower chest: Lung bases are normal. Hepatobiliary: Liver, gallbladder and biliary tree are normal. Pancreas: Normal. Spleen: Normal. Adrenals/Urinary Tract: Adrenal glands are normal. Kidneys are normal in size. Mild-to-moderate left-sided  hydronephrosis. Two stones over the lower pole left intrarenal collecting system with the larger measuring 1.5 cm. Large stone over the region of the left UPJ measuring 2.9 cm causing this obstruction. Minimal left perinephric inflammatory change. Remainder of the ureters and bladder are normal. Stomach/Bowel: Stomach and small bowel are normal. Appendix is normal. Colon is normal. Vascular/Lymphatic: Vascular structures are within normal. Several small left periaortic with the largest measuring 1.1 cm by short axis increased in size compared to the previous exam and may be reactive. Reproductive: Previous hysterectomy.  Ovaries are normal. Other: No free fluid. Musculoskeletal: Unremarkable. IMPRESSION: Left-sided nephrolithiasis. 2.9 cm stone at the left UPJ causing mild to moderate grade obstruction. Slight increase in size of several left periaortic lymph nodes with the largest measuring 1.1 cm likely reactive. Electronically Signed   By: Elberta Fortis M.D.   On: 10/21/2018 15:06    Microbiology: Recent Results (from the past 240 hour(s))  Urine Culture     Status: Abnormal   Collection Time: 10/21/18  1:51 PM  Result Value Ref Range Status   Specimen Description   Final    URINE, CLEAN CATCH Performed at [facility], [address], [place], [place] [address]    Special Requests   Final    NONE Performed at [facility], [address] [place], [place] [address]    Culture (A)  Final  10,000 COLONIES/mL MULTIPLE SPECIES PRESENT, SUGGEST RECOLLECTION   Report Status 10/22/2018 FINAL  Final  Culture, blood (routine x 2)     Status: None (Preliminary result)   Collection Time: 10/21/18 10:48 PM  Result Value Ref Range Status   Specimen Description BLOOD RIGHT ARM  Final   Special Requests   Final    BOTTLES DRAWN AEROBIC AND ANAEROBIC Blood Culture adequate volume   Culture   Final    NO GROWTH 2 DAYS Performed at [facility], [address] [place], [place] [address]    Report  Status PENDING  Incomplete  Culture, blood (routine x 2)     Status: None (Preliminary result)   Collection Time: 10/21/18 11:08 PM  Result Value Ref Range Status   Specimen Description BLOOD RIGHT FOREARM  Final   Special Requests   Final    BOTTLES DRAWN AEROBIC AND ANAEROBIC Blood Culture adequate volume   Culture   Final    NO GROWTH 2 DAYS Performed at [facility], [address] [place], [place] [address]    Report Status PENDING  Incomplete     Labs: Basic Metabolic Panel: Recent Labs  Lab 10/21/18 1554 10/22/18 0501 10/23/18 0511  NA 133* 136 139  K 3.5 4.2 4.7  CL 104 105 107  CO2 20* 23 25  GLUCOSE 92 93 85  BUN 9 6 6   CREATININE 0.99 0.86 0.81  CALCIUM 8.2* 8.1* 8.4*   CBC: Recent Labs  Lab 10/21/18 1554 10/22/18 0501 10/23/18 0511  WBC 14.9* 11.3* 8.4  NEUTROABS 11.9*  --   --   HGB 10.3* 10.3* 9.5*  HCT 32.3* 33.4* 30.2*  MCV 80.8 83.9 82.3  PLT 459* 416* 505*    Signed:  Vassie Lollarlos Cookie Pore MD.  [facility] 10/23/2018, 2:56 PM

## 2018-10-26 LAB — CULTURE, BLOOD (ROUTINE X 2)
Culture: NO GROWTH
Culture: NO GROWTH
Special Requests: ADEQUATE
Special Requests: ADEQUATE

## 2018-12-06 ENCOUNTER — Other Ambulatory Visit: Payer: Self-pay | Admitting: Urology

## 2018-12-07 ENCOUNTER — Other Ambulatory Visit (HOSPITAL_COMMUNITY): Payer: Self-pay | Admitting: Urology

## 2018-12-07 DIAGNOSIS — N200 Calculus of kidney: Secondary | ICD-10-CM

## 2019-01-29 NOTE — Patient Instructions (Addendum)
YOU NEED TO HAVE A COVID 19 TEST ON 01-31-2019 AT 10:20 AM. THIS TEST MUST BE DONE BEFORE SURGERY, COME TO [facility] ENTRANCE. ONCE YOUR COVID TEST IS COMPLETED, PLEASE BEGIN THE QUARANTINE INSTRUCTIONS AS OUTLINED IN YOUR HANDOUT.                [name]    Your procedure is scheduled on: 02-04-2019   Report to [facility] Main  Entrance    Report to RADIOLOGY  At :800 AM    Call this number if you have problems the morning of surgery [phone]    Remember: Do not eat food or drink liquids :After Midnight.     Take these medicines the morning of surgery with A SIP OF WATER: None  BRUSH YOUR TEETH MORNING OF SURGERY AND RINSE YOUR MOUTH OUT, NO CHEWING GUM CANDY OR MINTS.                                 You may not have any metal on your body including hair pins and              piercings     Do not wear jewelry, make-up, lotions, powders or perfumes, deodorant              Do not wear nail polish.  Do not shave  48 hours prior to surgery.              Do not bring valuables to the hospital. [place].  Contacts, dentures or bridgework may not be worn into surgery.       _____________________________________________________________________             [facility] - Preparing for Surgery Before surgery, you can play an important role.  Because skin is not sterile, your skin needs to be as free of germs as possible.  You can reduce the number of germs on your skin by washing with CHG (chlorahexidine gluconate) soap before surgery.  CHG is an antiseptic cleaner which kills germs and bonds with the skin to continue killing germs even after washing. Please DO NOT use if you have an allergy to CHG or antibacterial soaps.  If your skin becomes reddened/irritated stop using the CHG and inform your nurse when you arrive at Short Stay. Do not shave (including legs and underarms) for at least 48 hours  prior to the first CHG shower.  You may shave your face/neck. Please follow these instructions carefully:  1.  Shower with CHG Soap the night before surgery and the  morning of Surgery.  2.  If you choose to wash your hair, wash your hair first as usual with your  normal  shampoo.  3.  After you shampoo, rinse your hair and body thoroughly to remove the  shampoo.                           4.  Use CHG as you would any other liquid soap.  You can apply chg directly  to the skin and wash                       Gently with a scrungie or clean washcloth.  5.  Apply the CHG Soap to your body ONLY FROM THE NECK DOWN.  Do not use on face/ open                           Wound or open sores. Avoid contact with eyes, ears mouth and genitals (private parts).                       Wash face,  Genitals (private parts) with your normal soap.             6.  Wash thoroughly, paying special attention to the area where your surgery  will be performed.  7.  Thoroughly rinse your body with warm water from the neck down.  8.  DO NOT shower/wash with your normal soap after using and rinsing off  the CHG Soap.                9.  Pat yourself dry with a clean towel.            10.  Wear clean pajamas.            11.  Place clean sheets on your bed the night of your first shower and do not  sleep with pets. Day of Surgery : Do not apply any lotions/deodorants the morning of surgery.  Please wear clean clothes to the hospital/surgery center.  FAILURE TO FOLLOW THESE INSTRUCTIONS MAY RESULT IN THE CANCELLATION OF YOUR SURGERY PATIENT SIGNATURE_________________________________  NURSE SIGNATURE__________________________________  ________________________________________________________________________

## 2019-01-31 ENCOUNTER — Encounter (HOSPITAL_COMMUNITY): Payer: Self-pay

## 2019-01-31 ENCOUNTER — Encounter (HOSPITAL_COMMUNITY)
Admission: RE | Admit: 2019-01-31 | Discharge: 2019-01-31 | Disposition: A | Discharge status: Home / Self Care | Payer: BC Managed Care – PPO | Source: Ambulatory Visit | Attending: Urology | Admitting: Urology

## 2019-01-31 ENCOUNTER — Other Ambulatory Visit: Payer: Self-pay

## 2019-01-31 ENCOUNTER — Other Ambulatory Visit (HOSPITAL_COMMUNITY)
Admission: RE | Admit: 2019-01-31 | Discharge: 2019-01-31 | Disposition: A | Discharge status: Home / Self Care | Payer: BC Managed Care – PPO | Source: Ambulatory Visit | Attending: Urology | Admitting: Urology

## 2019-01-31 DIAGNOSIS — Z1159 Encounter for screening for other viral diseases: Secondary | ICD-10-CM | POA: Diagnosis not present

## 2019-01-31 DIAGNOSIS — N200 Calculus of kidney: Secondary | ICD-10-CM | POA: Diagnosis not present

## 2019-01-31 DIAGNOSIS — Z01812 Encounter for preprocedural laboratory examination: Secondary | ICD-10-CM | POA: Insufficient documentation

## 2019-01-31 LAB — BASIC METABOLIC PANEL
Anion gap: 8 (ref 5–15)
BUN: 11 mg/dL (ref 6–20)
CO2: 24 mmol/L (ref 22–32)
Calcium: 9.1 mg/dL (ref 8.9–10.3)
Chloride: 106 mmol/L (ref 98–111)
Creatinine, Ser: 0.74 mg/dL (ref 0.44–1.00)
GFR calc Af Amer: >60 mL/min (ref >60)
GFR calc non Af Amer: >60 mL/min (ref >60)
Glucose, Bld: 105 mg/dL — ABNORMAL HIGH (ref 70–99)
Potassium: 4 mmol/L (ref 3.5–5.1)
Sodium: 138 mmol/L (ref 135–145)

## 2019-01-31 LAB — CBC
HCT: 36.8 % (ref 36.0–46.0)
Hemoglobin: 11.5 g/dL — ABNORMAL LOW (ref 12.0–15.0)
MCH: 27.1 pg (ref 26.0–34.0)
MCHC: 31.3 g/dL (ref 30.0–36.0)
MCV: 86.6 fL (ref 80.0–100.0)
Platelets: 370 10*3/uL (ref 150–400)
RBC: 4.25 MIL/uL (ref 3.87–5.11)
RDW: 13.5 % (ref 11.5–15.5)
WBC: 7.4 10*3/uL (ref 4.0–10.5)
nRBC: 0 % (ref 0.0–0.2)

## 2019-01-31 LAB — SARS CORONAVIRUS 2 (TAT 6-24 HRS): SARS Coronavirus 2: NEGATIVE

## 2019-02-01 ENCOUNTER — Other Ambulatory Visit: Payer: Self-pay | Admitting: Radiology

## 2019-02-02 NOTE — Discharge Instructions (Signed)

## 2019-02-02 NOTE — H&P (Signed)
HPI: [name] is a 33 year-old female with LEFT renal calculi.  The stone was on the left side. She had Stent for treatment of her renal calculi. Patient denies Ureteroscopy, ESWL, and PCNL. This procedure was done 10/21/2018. She did not pass a stone since the last office visit. This is not her first kidney stone. She does have a stent in place.   She is not currently having flank pain, back pain, groin pain, nausea, vomiting, fever or chills.   She does not have dysuria. She does have urgency. She does have frequency. This condition would be considered of mild to moderate severity with no modifying factors or associated signs or symptoms other than as noted above.   11/05/18: She returns today for follow-up after having had an urgent stent placement for possible infection. She was obstructed and was having intermittent flank pain prior to coming into the hospital and having her stent placed. She is tolerating the stent fairly well but says she has urgency which she has had all her life. She does note which he describes as an odd sensation at the termination of urination the stent has been placed.     ALLERGIES: None   MEDICATIONS: Benadryl 25 MG TABS 0 Oral  Hydrocodone-Acetaminophen 5-325 MG Oral Tablet 0 Oral  Ibuprofen 200 MG Oral Tablet 0 Oral  Promethazine HCl - 25 MG Oral Tablet 0 Oral     GU PSH: Cysto Uretero Lithotripsy - 2016 Cystoscopy Insert Stent - 2016 ESWL - 2016 Hysterectomy Ureteroscopic stone removal - 2016      PSH Notes: Cystoscopy With Ureteroscopy With Lithotripsy, Cystoscopy With Insertion Of Ureteral Stent Left, Lithotripsy, Cystoscopy With Ureteroscopy With Removal Of Calculus, Tubal Ligation   NON-GU PSH: Tubal Ligation - 2016    GU PMH: Ureteral calculus, Ureteral calculus, left - 2016    NON-GU PMH: Anxiety, Anxiety - 2016 Encounter for general adult medical examination without abnormal findings, Encounter for preventive health examination -  2016 Personal history of other diseases of the circulatory system, History of hypertension - 2016    FAMILY HISTORY: 1 - Son Death of family member - Runs In Family Lung Cancer - Runs In Family nephrolithiasis - Runs In Family   SOCIAL HISTORY: Marital Status: Single Preferred Language: English; Ethnicity: Not Hispanic Or Latino; Race: White Current Smoking Status: Patient has never smoked.   Tobacco Use Assessment Completed: Used Tobacco in last 30 days? Has never drank.  Drinks 1 caffeinated drink per day.     Notes: Occupation, Single, Number of children, Caffeine use, Alcohol use, Never a smoker   VITAL SIGNS:    Weight 235 lb / 106.59 kg  Height 62 in / 157.48 cm  BP 110/62 mmHg  Pulse 78 /min  BMI 43.0 kg/m   PE: Eyes: PERRL, lids and conjunctivae normal ENMT: Mucous membranes are moist. Posterior pharynx clear of any exudate or lesions. Neck: normal, supple, no masses, no thyromegaly Respiratory: Normal respiratory effort. No accessory muscle use.  Cardiovascular: Regular rate and rhythm. No extremity edema. 2+ pedal pulses.   Abdomen: no tenderness, no masses palpated. No hepatosplenomegaly. Bowel sounds positive.  Musculoskeletal: no clubbing / cyanosis. No joint deformity upper and lower extremities. Good ROM, no contractures. Normal muscle tone.  Skin: no rashes, lesions, ulcers. No induration Neurologic: CN 2-12 grossly intact. Strength 5/5 in all 4.  Psychiatric: Normal judgment and insight. Alert and oriented x 3. Normal mood.   PAST DATA REVIEWED:  Source Of History:  Patient  Lab Test Review:   BUN/Creatinine, Calcium  Records Review:   Previous Hospital Records, Previous Patient Records, POC Tool  Urine Test Review:   Urine Culture  X-Ray Review: C.T. Abdomen/Pelvis: Reviewed Films. Previous CT scan images were reviewed.   Notes:                     Urine and blood cultures from her recent hospital admission were negative.  Her creatinine on 10/23/18 was  normal at 0.81 with a serum calcium at that time of 8.4.   PROCEDURES:         KUB - [id number]  A single view of the abdomen is obtained.      Patient confirmed No Neulasta OnPro Device. Her KUB images were independently reviewed and I note the stent is in good position. The 2 stones are noted within the left kidney with no right renal calculi.         Urinalysis w/Scope Dipstick Dipstick Cont'd Micro  Color: Yellow Bilirubin: Neg mg/dL WBC/hpf: 6 - 10/hpf  Appearance: Cloudy Ketones: Neg mg/dL RBC/hpf: 3 - 10/hpf  Specific Gravity: 1.015 Blood: 3+ ery/uL Bacteria: Rare (0-9/hpf)  pH: 6.0 Protein: 1+ mg/dL Cystals: NS (Not Seen)  Glucose: Neg mg/dL Urobilinogen: 0.2 mg/dL Casts: NS (Not Seen)    Nitrites: Neg Trichomonas: Not Present    Leukocyte Esterase: 2+ leu/uL Mucous: Not Present      Epithelial Cells: NS (Not Seen)      Yeast: NS (Not Seen)      Sperm: Not Present    ASSESSMENT/PLAN: Renal calculus - N20.0 Left, Stable - After a full discussion about the size of her stone and PCNL we have elected to proceed.              Notes:   Her left renal calculus has Hounsfield units of 1000. The largest stone measures 3 cm with a 2nd 11 mm stone in the lower pole.   We discussed the management of urinary stones. These options include observation, ureteroscopy, shockwave lithotripsy, and PCNL. We discussed which options are relevant to these particular stones. We discussed the natural history of stones as well as the complications of untreated stones and the impact on quality of life without treatment as well as with each of the above listed treatments. We also discussed the efficacy of each treatment in its ability to clear the stone burden. With any of these management options I discussed the signs and symptoms of infection and the need for emergent treatment should these be experienced. For each option we discussed the ability of each procedure to clear the patient of their stone burden.    For observation I described the risks which include but are not limited to silent renal damage, life-threatening infection, need for emergent surgery, failure to pass stone, and pain.   For ureteroscopy I described the risks which include heart attack, stroke, pulmonary embolus, death, bleeding, infection, damage to contiguous structures, positioning injury, ureteral stricture, ureteral avulsion, ureteral injury, need for ureteral stent, inability to perform ureteroscopy, need for an interval procedure, inability to clear stone burden, stent discomfort and pain.   For shockwave lithotripsy I described the risks which include arrhythmia, kidney contusion, kidney hemorrhage, need for transfusion, long-term risk of diabetes or hypertension, back discomfort, flank ecchymosis, flank abrasion, inability to break up stone, inability to pass stone fragments, Steinstrasse, infection associated with obstructing stones, need for different surgical procedure, need for repeat shockwave lithotripsy, and death.  For PCNL I described the risks including heart attack, sure, pulmonary embolus, death, positioning injury, pneumothorax, hydrothorax, need for chest tube, inability to clear stone burden, renal laceration, arterial venous fistula or malformation, need for embolization of kidney, loss of kidney or renal function, need for repeat procedure, need for prolonged nephrostomy tube, ureteral avulsion, fistula.

## 2019-02-04 ENCOUNTER — Ambulatory Visit (HOSPITAL_COMMUNITY): Payer: BC Managed Care – PPO | Admitting: Anesthesiology

## 2019-02-04 ENCOUNTER — Ambulatory Visit (HOSPITAL_COMMUNITY)
Admission: RE | Admit: 2019-02-04 | Discharge: 2019-02-04 | Disposition: A | Discharge status: Home / Self Care | Payer: BC Managed Care – PPO | Source: Ambulatory Visit | Attending: Urology | Admitting: Urology

## 2019-02-04 ENCOUNTER — Ambulatory Visit (HOSPITAL_COMMUNITY)
Admission: RE | Admit: 2019-02-04 | Discharge: 2019-02-05 | Disposition: A | Discharge status: Home / Self Care | Payer: BC Managed Care – PPO | Attending: Urology | Admitting: Urology

## 2019-02-04 ENCOUNTER — Ambulatory Visit (HOSPITAL_COMMUNITY): Payer: BC Managed Care – PPO

## 2019-02-04 ENCOUNTER — Other Ambulatory Visit: Payer: Self-pay

## 2019-02-04 ENCOUNTER — Encounter (HOSPITAL_COMMUNITY)
Admission: RE | Disposition: A | Discharge status: Home / Self Care | Payer: Self-pay | Source: Home / Self Care | Attending: Urology

## 2019-02-04 ENCOUNTER — Encounter (HOSPITAL_COMMUNITY): Payer: Self-pay

## 2019-02-04 ENCOUNTER — Encounter (HOSPITAL_COMMUNITY): Payer: Self-pay | Admitting: Certified Registered Nurse Anesthetist

## 2019-02-04 ENCOUNTER — Ambulatory Visit (HOSPITAL_COMMUNITY): Payer: BC Managed Care – PPO | Admitting: Emergency Medicine

## 2019-02-04 DIAGNOSIS — I10 Essential (primary) hypertension: Secondary | ICD-10-CM | POA: Insufficient documentation

## 2019-02-04 DIAGNOSIS — Z79899 Other long term (current) drug therapy: Secondary | ICD-10-CM | POA: Insufficient documentation

## 2019-02-04 DIAGNOSIS — N200 Calculus of kidney: Secondary | ICD-10-CM

## 2019-02-04 DIAGNOSIS — Z6841 Body Mass Index (BMI) 40.0 and over, adult: Secondary | ICD-10-CM | POA: Diagnosis not present

## 2019-02-04 DIAGNOSIS — Z79891 Long term (current) use of opiate analgesic: Secondary | ICD-10-CM | POA: Insufficient documentation

## 2019-02-04 DIAGNOSIS — Z791 Long term (current) use of non-steroidal anti-inflammatories (NSAID): Secondary | ICD-10-CM | POA: Diagnosis not present

## 2019-02-04 HISTORY — PX: CYSTOSCOPY: SHX5120

## 2019-02-04 HISTORY — DX: Personal history of urinary calculi: Z87.442

## 2019-02-04 HISTORY — PX: NEPHROLITHOTOMY: SHX5134

## 2019-02-04 HISTORY — PX: IR URETERAL STENT LEFT NEW ACCESS W/O SEP NEPHROSTOMY CATH: IMG6075

## 2019-02-04 LAB — CBC WITH DIFFERENTIAL/PLATELET
Abs Immature Granulocytes: 0.03 10*3/uL (ref 0.00–0.07)
Basophils Absolute: 0 10*3/uL (ref 0.0–0.1)
Basophils Relative: 0 %
Eosinophils Absolute: 0.1 10*3/uL (ref 0.0–0.5)
Eosinophils Relative: 1 %
HCT: 37.4 % (ref 36.0–46.0)
Hemoglobin: 11.5 g/dL — ABNORMAL LOW (ref 12.0–15.0)
Immature Granulocytes: 0 %
Lymphocytes Relative: 31 %
Lymphs Abs: 2.5 10*3/uL (ref 0.7–4.0)
MCH: 26.3 pg (ref 26.0–34.0)
MCHC: 30.7 g/dL (ref 30.0–36.0)
MCV: 85.6 fL (ref 80.0–100.0)
Monocytes Absolute: 0.6 10*3/uL (ref 0.1–1.0)
Monocytes Relative: 8 %
Neutro Abs: 5 10*3/uL (ref 1.7–7.7)
Neutrophils Relative %: 60 %
Platelets: 362 10*3/uL (ref 150–400)
RBC: 4.37 MIL/uL (ref 3.87–5.11)
RDW: 13.2 % (ref 11.5–15.5)
WBC: 8.3 10*3/uL (ref 4.0–10.5)
nRBC: 0 % (ref 0.0–0.2)

## 2019-02-04 LAB — BASIC METABOLIC PANEL
Anion gap: 10 (ref 5–15)
BUN: 11 mg/dL (ref 6–20)
CO2: 23 mmol/L (ref 22–32)
Calcium: 9 mg/dL (ref 8.9–10.3)
Chloride: 104 mmol/L (ref 98–111)
Creatinine, Ser: 0.64 mg/dL (ref 0.44–1.00)
GFR calc Af Amer: >60 mL/min (ref >60)
GFR calc non Af Amer: >60 mL/min (ref >60)
Glucose, Bld: 92 mg/dL (ref 70–99)
Potassium: 4.2 mmol/L (ref 3.5–5.1)
Sodium: 137 mmol/L (ref 135–145)

## 2019-02-04 LAB — PROTIME-INR
INR: 0.9 (ref 0.8–1.2)
Prothrombin Time: 12.4 seconds (ref 11.4–15.2)

## 2019-02-04 SURGERY — NEPHROLITHOTOMY PERCUTANEOUS
Anesthesia: General | Site: Ureter | Laterality: Left

## 2019-02-04 MED ORDER — ACETAMINOPHEN 500 MG PO TABS
1000.0000 mg | ORAL_TABLET | Freq: Four times a day (QID) | ORAL | Status: DC
  Administered 2019-02-04 – 2019-02-05 (×2): 1000 mg via ORAL
  Filled 2019-02-04 (×2): qty 2

## 2019-02-04 MED ORDER — SODIUM CHLORIDE 0.9 % IR SOLN
Status: DC | PRN
  Administered 2019-02-04: 27000 mL

## 2019-02-04 MED ORDER — FENTANYL CITRATE (PF) 100 MCG/2ML IJ SOLN
INTRAMUSCULAR | Status: AC
  Filled 2019-02-04: qty 2

## 2019-02-04 MED ORDER — ONDANSETRON HCL 4 MG/2ML IJ SOLN
INTRAMUSCULAR | Status: DC | PRN
  Administered 2019-02-04: 4 mg via INTRAVENOUS

## 2019-02-04 MED ORDER — FENTANYL CITRATE (PF) 100 MCG/2ML IJ SOLN
INTRAMUSCULAR | Status: AC
  Filled 2019-02-04: qty 4

## 2019-02-04 MED ORDER — SODIUM CHLORIDE 0.9 % IV SOLN
INTRAVENOUS | Status: DC
  Administered 2019-02-04 – 2019-02-05 (×2): via INTRAVENOUS

## 2019-02-04 MED ORDER — OXYBUTYNIN CHLORIDE 5 MG PO TABS: 5.0000 mg | ORAL_TABLET | Freq: Three times a day (TID) | ORAL | Status: DC | PRN

## 2019-02-04 MED ORDER — DEXAMETHASONE SODIUM PHOSPHATE 10 MG/ML IJ SOLN
INTRAMUSCULAR | Status: DC | PRN
  Administered 2019-02-04: 5 mg via INTRAVENOUS

## 2019-02-04 MED ORDER — LIDOCAINE HCL 1 % IJ SOLN
INTRAMUSCULAR | Status: AC
  Filled 2019-02-04: qty 20

## 2019-02-04 MED ORDER — FENTANYL CITRATE (PF) 250 MCG/5ML IJ SOLN
INTRAMUSCULAR | Status: AC
  Filled 2019-02-04: qty 5

## 2019-02-04 MED ORDER — OXYCODONE-ACETAMINOPHEN 5-325 MG PO TABS
1.0000 | ORAL_TABLET | ORAL | Status: DC | PRN
  Administered 2019-02-04: 2 via ORAL
  Administered 2019-02-05 (×2): 1 via ORAL
  Filled 2019-02-04: qty 1
  Filled 2019-02-04 (×2): qty 2

## 2019-02-04 MED ORDER — CEFAZOLIN SODIUM-DEXTROSE 2-4 GM/100ML-% IV SOLN: 2.0000 g | Freq: Once | INTRAVENOUS | Status: DC

## 2019-02-04 MED ORDER — PROPOFOL 10 MG/ML IV BOLUS
INTRAVENOUS | Status: AC
  Filled 2019-02-04: qty 20

## 2019-02-04 MED ORDER — ACETAMINOPHEN 325 MG PO TABS: 650.0000 mg | ORAL_TABLET | ORAL | Status: DC | PRN

## 2019-02-04 MED ORDER — SUGAMMADEX SODIUM 200 MG/2ML IV SOLN
INTRAVENOUS | Status: DC | PRN
  Administered 2019-02-04: 200 mg via INTRAVENOUS

## 2019-02-04 MED ORDER — CEFAZOLIN SODIUM-DEXTROSE 1-4 GM/50ML-% IV SOLN
1.0000 g | Freq: Three times a day (TID) | INTRAVENOUS | Status: AC
  Administered 2019-02-04 – 2019-02-05 (×2): 1 g via INTRAVENOUS
  Filled 2019-02-04 (×2): qty 50

## 2019-02-04 MED ORDER — ONDANSETRON HCL 4 MG/2ML IJ SOLN
INTRAMUSCULAR | Status: AC
  Filled 2019-02-04: qty 2

## 2019-02-04 MED ORDER — CEFAZOLIN SODIUM-DEXTROSE 2-4 GM/100ML-% IV SOLN
INTRAVENOUS | Status: AC
  Administered 2019-02-04: 2 g via INTRAVENOUS
  Filled 2019-02-04: qty 100

## 2019-02-04 MED ORDER — LIDOCAINE 2% (20 MG/ML) 5 ML SYRINGE
INTRAMUSCULAR | Status: DC | PRN
  Administered 2019-02-04: 80 mg via INTRAVENOUS

## 2019-02-04 MED ORDER — LIDOCAINE HCL (PF) 1 % IJ SOLN
INTRAMUSCULAR | Status: AC | PRN
  Administered 2019-02-04: 10 mL

## 2019-02-04 MED ORDER — ONDANSETRON HCL 4 MG/2ML IJ SOLN
4.0000 mg | INTRAMUSCULAR | Status: DC | PRN
  Administered 2019-02-04: 15:00:00 4 mg via INTRAVENOUS

## 2019-02-04 MED ORDER — SUCCINYLCHOLINE CHLORIDE 20 MG/ML IJ SOLN
INTRAMUSCULAR | Status: DC | PRN
  Administered 2019-02-04: 120 mg via INTRAVENOUS

## 2019-02-04 MED ORDER — ROCURONIUM BROMIDE 10 MG/ML (PF) SYRINGE
PREFILLED_SYRINGE | INTRAVENOUS | Status: DC | PRN
  Administered 2019-02-04: 50 mg via INTRAVENOUS
  Administered 2019-02-04 (×2): 10 mg via INTRAVENOUS

## 2019-02-04 MED ORDER — MIDAZOLAM HCL 2 MG/2ML IJ SOLN
INTRAMUSCULAR | Status: AC | PRN
  Administered 2019-02-04: 2 mg via INTRAVENOUS
  Administered 2019-02-04 (×4): 1 mg via INTRAVENOUS

## 2019-02-04 MED ORDER — BACITRACIN-NEOMYCIN-POLYMYXIN 400-5-5000 EX OINT: 1.0000 "application " | TOPICAL_OINTMENT | Freq: Three times a day (TID) | CUTANEOUS | Status: DC | PRN

## 2019-02-04 MED ORDER — HYDROMORPHONE HCL 1 MG/ML IJ SOLN
0.5000 mg | INTRAMUSCULAR | Status: DC | PRN
  Administered 2019-02-04: 21:00:00 0.5 mg via INTRAVENOUS
  Administered 2019-02-05 (×2): 1 mg via INTRAVENOUS
  Filled 2019-02-04 (×3): qty 1

## 2019-02-04 MED ORDER — PROPOFOL 10 MG/ML IV BOLUS
INTRAVENOUS | Status: DC | PRN
  Administered 2019-02-04: 200 mg via INTRAVENOUS

## 2019-02-04 MED ORDER — MIDAZOLAM HCL 5 MG/5ML IJ SOLN
INTRAMUSCULAR | Status: DC | PRN
  Administered 2019-02-04: 2 mg via INTRAVENOUS

## 2019-02-04 MED ORDER — FENTANYL CITRATE (PF) 100 MCG/2ML IJ SOLN
INTRAMUSCULAR | Status: AC | PRN
  Administered 2019-02-04 (×2): 50 ug via INTRAVENOUS

## 2019-02-04 MED ORDER — MIDAZOLAM HCL 2 MG/2ML IJ SOLN
INTRAMUSCULAR | Status: AC
  Filled 2019-02-04: qty 2

## 2019-02-04 MED ORDER — ACETAMINOPHEN 500 MG PO TABS
1000.0000 mg | ORAL_TABLET | Freq: Once | ORAL | Status: AC
  Administered 2019-02-04: 1000 mg via ORAL
  Filled 2019-02-04: qty 2

## 2019-02-04 MED ORDER — MIDAZOLAM HCL 2 MG/2ML IJ SOLN
INTRAMUSCULAR | Status: AC
  Filled 2019-02-04: qty 4

## 2019-02-04 MED ORDER — FENTANYL CITRATE (PF) 100 MCG/2ML IJ SOLN
25.0000 ug | INTRAMUSCULAR | Status: DC | PRN
  Administered 2019-02-04: 25 ug via INTRAVENOUS

## 2019-02-04 MED ORDER — LACTATED RINGERS IV SOLN
INTRAVENOUS | Status: DC
  Administered 2019-02-04 (×3): via INTRAVENOUS

## 2019-02-04 MED ORDER — IOHEXOL 300 MG/ML  SOLN
50.0000 mL | Freq: Once | INTRAMUSCULAR | Status: AC | PRN
  Administered 2019-02-04: 15 mL

## 2019-02-04 MED ORDER — CEFAZOLIN SODIUM-DEXTROSE 2-4 GM/100ML-% IV SOLN
2.0000 g | INTRAVENOUS | Status: AC
  Administered 2019-02-04: 10:00:00 2 g via INTRAVENOUS

## 2019-02-04 MED ORDER — FENTANYL CITRATE (PF) 100 MCG/2ML IJ SOLN
INTRAMUSCULAR | Status: DC | PRN
  Administered 2019-02-04: 50 ug via INTRAVENOUS
  Administered 2019-02-04 (×2): 100 ug via INTRAVENOUS
  Administered 2019-02-04: 50 ug via INTRAVENOUS
  Administered 2019-02-04: 100 ug via INTRAVENOUS
  Administered 2019-02-04: 50 ug via INTRAVENOUS

## 2019-02-04 MED ORDER — IOHEXOL 300 MG/ML  SOLN
INTRAMUSCULAR | Status: DC | PRN
  Administered 2019-02-04: 10 mL

## 2019-02-04 SURGICAL SUPPLY — 52 items
APPLICATOR SURGIFLO ENDO (HEMOSTASIS) ×1 IMPLANT
BAG URINE DRAINAGE (UROLOGICAL SUPPLIES) ×1 IMPLANT
BASKET ZERO TIP NITINOL 2.4FR (BASKET) ×1 IMPLANT
BENZOIN TINCTURE PRP APPL 2/3 (GAUZE/BANDAGES/DRESSINGS) ×3 IMPLANT
BLADE SURG 15 STRL LF DISP TIS (BLADE) ×2 IMPLANT
BLADE SURG 15 STRL SS (BLADE) ×1
CATCHER STONE W/TUBE ADAPTER (UROLOGICAL SUPPLIES) IMPLANT
CATH FOLEY 2W COUNCIL 20FR 5CC (CATHETERS) IMPLANT
CATH FOLEY 2WAY SLVR  5CC 18FR (CATHETERS) ×1
CATH FOLEY 2WAY SLVR 5CC 18FR (CATHETERS) IMPLANT
CATH IMAGER II 65CM (CATHETERS) IMPLANT
CATH X-FORCE N30 NEPHROSTOMY (TUBING) ×3 IMPLANT
CHLORAPREP W/TINT 26 (MISCELLANEOUS) ×3 IMPLANT
COVER SURGICAL LIGHT HANDLE (MISCELLANEOUS) ×3 IMPLANT
COVER WAND RF STERILE (DRAPES) IMPLANT
DERMABOND ADVANCED (GAUZE/BANDAGES/DRESSINGS) ×1
DERMABOND ADVANCED .7 DNX12 (GAUZE/BANDAGES/DRESSINGS) IMPLANT
DRAPE C-ARM 42X120 X-RAY (DRAPES) ×3 IMPLANT
DRAPE LINGEMAN PERC (DRAPES) ×3 IMPLANT
DRAPE SURG IRRIG POUCH 19X23 (DRAPES) ×3 IMPLANT
DRSG PAD ABDOMINAL 8X10 ST (GAUZE/BANDAGES/DRESSINGS) ×4 IMPLANT
DRSG TEGADERM 4X4.75 (GAUZE/BANDAGES/DRESSINGS) ×1 IMPLANT
DRSG TEGADERM 8X12 (GAUZE/BANDAGES/DRESSINGS) IMPLANT
FIBER LASER TRAC TIP (UROLOGICAL SUPPLIES) IMPLANT
GAUZE SPONGE 4X4 12PLY STRL (GAUZE/BANDAGES/DRESSINGS) ×3 IMPLANT
GLOVE BIOGEL M 8.0 STRL (GLOVE) ×3 IMPLANT
GOWN STRL REUS W/TWL XL LVL3 (GOWN DISPOSABLE) ×3 IMPLANT
GUIDEWIRE AMPLAZ .035X145 (WIRE) ×6 IMPLANT
GUIDEWIRE STR DUAL SENSOR (WIRE) IMPLANT
HOLDER FOLEY CATH W/STRAP (MISCELLANEOUS) ×3 IMPLANT
KIT BASIN OR (CUSTOM PROCEDURE TRAY) ×3 IMPLANT
KIT PROBE 340X3.4XDISP GRN (MISCELLANEOUS) IMPLANT
KIT PROBE TRILOGY 3.4X340 (MISCELLANEOUS)
KIT PROBE TRILOGY 3.9X350 (MISCELLANEOUS) ×1 IMPLANT
KIT TURNOVER KIT A (KITS) IMPLANT
MANIFOLD NEPTUNE II (INSTRUMENTS) ×4 IMPLANT
NS IRRIG 1000ML POUR BTL (IV SOLUTION) ×3 IMPLANT
PACK CYSTO (CUSTOM PROCEDURE TRAY) ×3 IMPLANT
SHEATH PEELAWAY SET 9 (SHEATH) ×3 IMPLANT
SPONGE LAP 4X18 RFD (DISPOSABLE) ×3 IMPLANT
STENT CONTOUR 6FRX24X.038 (STENTS) ×1 IMPLANT
STONE CATCHER W/TUBE ADAPTER (UROLOGICAL SUPPLIES) ×3 IMPLANT
SURGIFLO W/THROMBIN 8M KIT (HEMOSTASIS) ×1 IMPLANT
SUT MNCRL AB 4-0 PS2 18 (SUTURE) ×1 IMPLANT
SUT SILK 2 0 30  PSL (SUTURE)
SUT SILK 2 0 30 PSL (SUTURE) IMPLANT
SYR 10ML LL (SYRINGE) ×3 IMPLANT
SYR 20CC LL (SYRINGE) ×6 IMPLANT
TOWEL OR 17X26 10 PK STRL BLUE (TOWEL DISPOSABLE) ×3 IMPLANT
TOWEL OR NON WOVEN STRL DISP B (DISPOSABLE) ×3 IMPLANT
TRAY FOLEY MTR SLVR 16FR STAT (SET/KITS/TRAYS/PACK) ×3 IMPLANT
TUBING CONNECTING 10 (TUBING) ×6 IMPLANT

## 2019-02-04 NOTE — Op Note (Addendum)
PATIENT:  [name]  PRE-OPERATIVE DIAGNOSIS:  Left nephrolithiasis  POST-OPERATIVE DIAGNOSIS: Same  PROCEDURE: 1. Percutaneous nephrostomy sheath placement. 2.  Left percutaneous nephrolithotomy (>2cm.) 3. Antegrade double-J stent placement. 4. Cystoscopy with ureteral stent removal   SURGEON:  Garnett FarmMark C Josuel Koeppen ASSISTING: Federico FlakeSolomon Hayon   INDICATION: Angela Divereresa Basic is a 33 year-old female with LEFT renal calculi. Stent on 10/21/2018. Ir for left perc tube AM of 7/20. Due to large stone burden elected for PCNL. Consent obtained after discussion of risks and benefits.   She did not pass a stone since the last office visit. This is not her first kidney stone. She does have a stent in place.  ANESTHESIA:  General  EBL:  Minimal  DRAINS:  Foley, 3518fr  LOCAL MEDICATIONS USED:  None  SPECIMEN:    Description of procedure: After informed consent the patient was taken to the operating room and administered general endotracheal anesthesia. Once fully anesthetized the patient was placed in the frog-leg position.  A flexible cystoscope was advanced into the urethra and the stent was grasped and removed under visual guidance.  Bladder was left full and an 4518 French Foley catheter was place.   Patient was then moved from the stretcher onto the operating room table in a prone position with bony prominences padded and chest pads in place. The flank with exiting nephrostomy catheter was then sterilely prepped and draped in standard fashion. An official timeout was then performed.  Using the existing nephrostomy catheter access I passed a 0.038 inch floppy tipped guidewire down the ureter into the bladder under fluoroscopy.  This was left in place and the nephrostomy catheter was removed.  A transverse incision was made over the guidewire and a peel-away coaxial catheter was then passed over the guidewire and down the ureter under fluoroscopy.  The inner portion of the coaxial system was then removed  and a second guidewire was passed through this catheter and down the ureter into the bladder under fluoroscopy.  The coaxial catheter was then removed and one of the guidewires was secured to the drape as a safety guidewire and the second guidewire was used as a working guidewire.  The NephroMax nephrostomy dilating balloon was then passed over the working guidewire into the area of the renal pelvis under fluoroscopy.  It was then inflated using dilute contrast under fluoroscopy until the balloon was fully inflated.  I then passed the 28 French nephrostomy access sheath over the balloon into the area of the renal pelvis under fluoroscopy and then deflated the balloon and removed the dilating balloon.  The 1726 French rigid nephroscope was then passed under direct vision through the nephrostomy access sheath.  Clotted blood was evacuated and the stone was identified.  The renal pelvis stone was treated first using combination of lithotrite as well as flexible nephroscope and basket.  The stone was dense and difficult to break up.  Several large fragments migrated to altered parts of the kidney which were retrieved and also treated.    We then withdrew the sheath partly and turned our attention to the lower pole of the kidney and encountered the patient second large stone.  This was again treated with lithotrite as well as flexible nephroscopy and basket extraction.  We then per formed pan pyeloscopy using the flexible nephroscope.  Remaining small fragments were removed using suction on rigid nephroscope and lithotrite.  Minimal bleeding at the end of the procedure.  At this point we decided to proceed with antegrade  stent placement.  I wire was backloaded through the rigid nephroscope and a 6 x 24 cm stent was advanced through the nephroscope under direct and fluoroscopic guidance.  There is an appropriate curl in the bladder and the renal pelvis.  Safety wire was removed.  Distance between the skin and  the renal parenchyma was measured and then FloSeal was injected into the PCNL tract for hemostasis.  The skin incision was closed using a running 4-0 Monocryl in a subcuticular fashion.  Dermabond was placed over the wound.   PLAN OF CARE:  -No postoperative labs needed unless tachycardic or hypotensive -Regular diet -Fluids at 125 -Continue perioperative antibiotics -CT scan in the morning to evaluate for residual stone burden -Likely discharge to home after an overnight stay.  PATIENT DISPOSITION:  PACU - hemodynamically stable.

## 2019-02-04 NOTE — Progress Notes (Signed)
Pts mother inquiring on pt status, updated that surgery began on time(1200) and posted to done about 3pm.  Instructed to wait in surgical waiting for call, or call into PACU if she becomes too worried.

## 2019-02-04 NOTE — Consult Note (Signed)
Chief Complaint: Patient was seen in consultation today for left percutaneous nephrostomy/nephroureteral catheter placement   Referring Physician(s): Ottelin,M  Supervising Physician: Jacqulynn Cadet  Patient Status: [facility] - Out-pt  TBA  History of Present Illness: [name] is a 33 y.o. female with history of left flank pain, pelvic pressure and nephrolithiasis/UTI/urosepsis in April of this year with double-J stent placement on 10/21/2018.  Prior imaging had revealed 2.9 cm stone at the left UPJ.  She presents today for left nephrostomy/nephroureteral catheter placement prior to nephrolithotomy.  Past Medical History:  Diagnosis Date  . Back pain   . History of kidney stones   . Renal disorder     Past Surgical History:  Procedure Laterality Date  . ABDOMINAL HYSTERECTOMY    . CYSTOSCOPY WITH RETROGRADE PYELOGRAM, URETEROSCOPY AND STENT PLACEMENT Left 10/17/2014   Procedure: CYSTOSCOPY AND URETERAL DILATION WITH LEFT RETROGRADE PYELOGRAM, URETEROSCOPY AND STENT PLACEMENT;  Surgeon: Kathie Rhodes, MD;  Location: [facility];  Service: Urology;  Laterality: Left;  . CYSTOSCOPY WITH STENT PLACEMENT Left 10/21/2018   Procedure: CYSTOSCOPY WITH  JJ STENT PLACEMENT;  Surgeon: Franchot Gallo, MD;  Location: [facility];  Service: Urology;  Laterality: Left;  . HOLMIUM LASER APPLICATION Left 08/23/7122   Procedure: HOLMIUM LASER LITHOTRIPSY ;  Surgeon: Kathie Rhodes, MD;  Location: [facility];  Service: Urology;  Laterality: Left;  . STONE EXTRACTION WITH BASKET    . TUBAL LIGATION      Allergies: Patient has no known allergies.  Medications: Prior to Admission medications   Medication Sig Start Date End Date Taking? Authorizing Provider  ibuprofen (ADVIL) 200 MG tablet Take 800 mg by mouth every 6 (six) hours as needed for moderate pain.   Yes [provider]  Vitamin D, Ergocalciferol, (DRISDOL) 1.25 MG (50000 UT) CAPS capsule Take 50,000  Units by mouth every Monday.   Yes [provider]  ondansetron (ZOFRAN ODT) 8 MG disintegrating tablet Take 1 tablet (8 mg total) by mouth every 8 (eight) hours as needed for nausea or vomiting. Patient not taking: Reported on 01/24/2019 10/23/18   Barton Dubois, MD  oxybutynin (DITROPAN) 5 MG tablet Take 1 tablet (5 mg total) by mouth every 8 (eight) hours as needed for bladder spasms. 10/23/18   Barton Dubois, MD     No family history on file.  Social History   Socioeconomic History  . Marital status: Single    Spouse name: Not on file  . Number of children: Not on file  . Years of education: Not on file  . Highest education level: Not on file  Occupational History  . Not on file  Social Needs  . Financial resource strain: Not on file  . Food insecurity    Worry: Not on file    Inability: Not on file  . Transportation needs    Medical: Not on file    Non-medical: Not on file  Tobacco Use  . Smoking status: Never Smoker  . Smokeless tobacco: Never Used  Substance and Sexual Activity  . Alcohol use: No  . Drug use: No  . Sexual activity: Not on file  Lifestyle  . Physical activity    Days per week: Not on file    Minutes per session: Not on file  . Stress: Not on file  Relationships  . Social Herbalist on phone: Not on file    Gets together: Not on file    Attends religious service: Not  on file    Active member of club or organization: Not on file    Attends meetings of clubs or organizations: Not on file    Relationship status: Not on file  Other Topics Concern  . Not on file  Social History Narrative  . Not on file      Review of Systems see above; denies fever, headache, chest pain, dyspnea, cough, nausea, vomiting or bleeding.  Vital Signs: Blood pressure 131/78, heart rate 78, temperature 98.8, respirations 16, O2 sat 100% room air LMP 07/01/2014 Comment: neg preg  Physical Exam awake, alert.  Chest clear to auscultation bilaterally.   Heart with regular rate and rhythm.  Abdomen soft, positive bowel sounds, currently nontender.  Mild left CVA tenderness.  Extremities with full range of motion.  Imaging: No results found.  Labs:  CBC: Recent Labs    10/22/18 0501 10/23/18 0511 01/31/19 0922 02/04/19 0800  WBC 11.3* 8.4 7.4 8.3  HGB 10.3* 9.5* 11.5* 11.5*  HCT 33.4* 30.2* 36.8 37.4  PLT 416* 505* 370 362    COAGS: Recent Labs    02/04/19 0800  INR 0.9    BMP: Recent Labs    10/21/18 1554 10/22/18 0501 10/23/18 0511 01/31/19 0922  NA 133* 136 139 138  K 3.5 4.2 4.7 4.0  CL 104 105 107 106  CO2 20* 23 25 24   GLUCOSE 92 93 85 105*  BUN 9 6 6 11   CALCIUM 8.2* 8.1* 8.4* 9.1  CREATININE 0.99 0.86 0.81 0.74  GFRNONAA >60 >60 >60 >60  GFRAA >60 >60 >60 >60    LIVER FUNCTION TESTS: No results for input(s): BILITOT, AST, ALT, ALKPHOS, PROT, ALBUMIN in the last 8760 hours.  TUMOR MARKERS: No results for input(s): AFPTM, CEA, CA199, CHROMGRNA in the last 8760 hours.  Assessment and Plan: 33 y.o. female with history of left flank pain, pelvic pressure and nephrolithiasis/UTI/urosepsis in April of this year with double-J stent placement on 10/21/2018.  Prior imaging had revealed 2.9 cm stone at the left UPJ.  She presents today for left nephrostomy/nephroureteral catheter placement prior to nephrolithotomy.Risks and benefits of left PCN placement was discussed with the patient including, but not limited to, infection, bleeding, significant bleeding causing loss or decrease in renal function or damage to adjacent structures.  She is COVID-19 negative with normal renal function.  All of the patient's questions were answered, patient is agreeable to proceed.  Consent signed and in chart.      Thank you for this interesting consult.  I greatly enjoyed meeting [name] and look forward to participating in their care.  A copy of this report was sent to the requesting provider on this date.   Electronically Signed: D. Jeananne RamaKevin , PA-C 02/04/2019, 8:54 AM   I spent a total of 25 minutes  in face to face in clinical consultation, greater than 50% of which was counseling/coordinating care for left percutaneous nephrostomy/nephroureteral catheter placement

## 2019-02-04 NOTE — Anesthesia Procedure Notes (Signed)
Procedure Name: Intubation Performed by: Gean Maidens, CRNA Pre-anesthesia Checklist: Patient identified, Emergency Drugs available, Suction available, Patient being monitored and Timeout performed Patient Re-evaluated:Patient Re-evaluated prior to induction Oxygen Delivery Method: Circle system utilized Preoxygenation: Pre-oxygenation with 100% oxygen Induction Type: IV induction Ventilation: Mask ventilation without difficulty Laryngoscope Size: Mac and 4 Grade View: Grade I Tube type: Oral Tube size: 7.0 mm Number of attempts: 1 Airway Equipment and Method: Stylet Placement Confirmation: ETT inserted through vocal cords under direct vision,  positive ETCO2 and breath sounds checked- equal and bilateral Secured at: 23 cm Tube secured with: Tape Dental Injury: Teeth and Oropharynx as per pre-operative assessment

## 2019-02-04 NOTE — Anesthesia Postprocedure Evaluation (Signed)
Anesthesia Post Note  Patient: [name]  Procedure(s) Performed: NEPHROLITHOTOMY PERCUTANEOUS (Left Renal) CYSTOSCOPY flexible removal of left ureteral stent (Left Ureter)     Patient location during evaluation: PACU Anesthesia Type: General Level of consciousness: awake and alert Pain management: pain level controlled Vital Signs Assessment: post-procedure vital signs reviewed and stable Respiratory status: spontaneous breathing, nonlabored ventilation, respiratory function stable and patient connected to nasal cannula oxygen Cardiovascular status: blood pressure returned to baseline and stable Postop Assessment: no apparent nausea or vomiting Anesthetic complications: no    Last Vitals:  Vitals:   02/04/19 1600 02/04/19 1615  BP: (!) 143/86 134/80  Pulse: (!) 108 95  Resp: 15 12  Temp:    SpO2: 100% 100%    Last Pain:  Vitals:   02/04/19 1615  PainSc: Asleep                 Doniel Maiello L Chistian Kasler

## 2019-02-04 NOTE — Procedures (Signed)
Interventional Radiology Procedure Note  Procedure: Successful left percutaneous renal access via stone containing lower pole.  A 17F catheter was advanced into the bladder.   Complications: None  Estimated Blood Loss: none  Recommendations: - To OR for PCNL - Please note ureteral stent was displaced into the bladder  Signed,  Criselda Peaches, MD

## 2019-02-04 NOTE — Plan of Care (Signed)

## 2019-02-04 NOTE — Anesthesia Preprocedure Evaluation (Addendum)
Anesthesia Evaluation  Patient identified by MRN, date of birth, ID band Patient awake    Reviewed: Allergy & Precautions, NPO status , Patient's Chart, lab work & pertinent test results  Airway Mallampati: III  TM Distance: >3 FB Neck ROM: Full  Mouth opening: Limited Mouth Opening  Dental no notable dental hx. (+) Teeth Intact, Dental Advisory Given   Pulmonary neg pulmonary ROS,    Pulmonary exam normal breath sounds clear to auscultation       Cardiovascular negative cardio ROS Normal cardiovascular exam Rhythm:Regular Rate:Normal     Neuro/Psych negative neurological ROS  negative psych ROS   GI/Hepatic negative GI ROS, Neg liver ROS,   Endo/Other  Morbid obesity  Renal/GU negative Renal ROS  negative genitourinary   Musculoskeletal negative musculoskeletal ROS (+)   Abdominal   Peds  Hematology  (+) Blood dyscrasia (Hgb 11.5), anemia ,   Anesthesia Other Findings   Reproductive/Obstetrics                            Anesthesia Physical Anesthesia Plan  ASA: III  Anesthesia Plan: General   Post-op Pain Management:    Induction: Intravenous  PONV Risk Score and Plan: 3 and Ondansetron, Dexamethasone and Midazolam  Airway Management Planned: Oral ETT and Video Laryngoscope Planned  Additional Equipment:   Intra-op Plan:   Post-operative Plan: Extubation in OR  Informed Consent: I have reviewed the patients History and Physical, chart, labs and discussed the procedure including the risks, benefits and alternatives for the proposed anesthesia with the patient or authorized representative who has indicated his/her understanding and acceptance.     Dental advisory given  Plan Discussed with: CRNA  Anesthesia Plan Comments:        Anesthesia Quick Evaluation

## 2019-02-04 NOTE — Transfer of Care (Signed)
Immediate Anesthesia Transfer of Care Note  Patient: [name]  Procedure(s) Performed: NEPHROLITHOTOMY PERCUTANEOUS (Left Renal) CYSTOSCOPY flexible removal of left ureteral stent (Left Ureter)  Patient Location: PACU  Anesthesia Type:General  Level of Consciousness: awake, alert  and oriented  Airway & Oxygen Therapy: Patient Spontanous Breathing and Patient connected to face mask oxygen  Post-op Assessment: Report given to RN and Post -op Vital signs reviewed and stable  Post vital signs: Reviewed and stable  Last Vitals:  Vitals Value Taken Time  BP 143/78 02/04/19 1510  Temp    Pulse 90 02/04/19 1511  Resp 12 02/04/19 1511  SpO2 100 % 02/04/19 1511  Vitals shown include unvalidated device data.  Last Pain: There were no vitals filed for this visit.       Complications: No apparent anesthesia complications

## 2019-02-05 ENCOUNTER — Encounter (HOSPITAL_COMMUNITY): Payer: Self-pay | Admitting: Urology

## 2019-02-05 DIAGNOSIS — N200 Calculus of kidney: Secondary | ICD-10-CM | POA: Diagnosis not present

## 2019-02-05 LAB — URINE CULTURE: Culture: NO GROWTH

## 2019-02-05 MED ORDER — OXYCODONE HCL 10 MG PO TABS: 10.0000 mg | ORAL_TABLET | ORAL | 0 refills | Status: DC | PRN

## 2019-02-05 MED ORDER — SULFAMETHOXAZOLE-TRIMETHOPRIM 800-160 MG PO TABS: 1.0000 | ORAL_TABLET | Freq: Two times a day (BID) | ORAL | Status: DC

## 2019-02-05 NOTE — Discharge Summary (Addendum)
[facility] Discharge Summary  Admit date: 02/04/2019  Discharge date and time: 02/05/19   Discharge to: Home  Discharge Service: Urology  Discharge Attending Physician:  Karsten Ro  Discharge  Diagnoses: Left renal stone  OR Procedures: Procedure(s): NEPHROLITHOTOMY PERCUTANEOUS CYSTOSCOPY flexible removal of left ureteral stent 02/04/2019   Ancillary Procedures: None   Discharge Day Services: The patient was seen and examined by the Urology team both in the morning and immediately prior to discharge.  Vital signs and laboratory values were stable and within normal limits.  The physical exam was benign and unchanged and all surgical wounds were examined.  Discharge instructions were explained and all questions answered.  Subjective  No acute events overnight. Pain Controlled. No fever or chills.  Passed trial void.  Objective No data found. Total I/O In: -  Out: 1300 [Urine:1300]  General: Alert and oriented CV: RRR Lungs: Normal work of breathing Abdomen: Soft, nontender GU: Left flank incision healing well. Foley in place draining light red urine Ext: NT, No erythema  Hospital Course:  The patient underwent left ureteral stent placement in interventional radiology followed by left PCNL on 02/04/2019.  Intraoperatively had large dense stone burden but stone was treated successfully with antegrade placement of JJ stent.    The patient tolerated the procedure well, was extubated in the OR, and afterwards was taken to the PACU for routine post-surgical care. When stable the patient was transferred to the floor.   The patient did well postoperatively.  Vitals remained stable and pain was controlled.  CT scan on postoperative day 0 showed 2 small stone fragments adjacent to the ureteral stent.  Fragments were less than 3 mm in size.  After discussion with the patient decision was made to leave the stone fragments as they would likely pass on their own after passive dilation  of the ureter from indwelling ureteral stent.    The patient's diet was slowly advanced and at the time of discharge was tolerating a regular diet.  The patient was discharged home 1 Day Post-Op, at which point was tolerating a regular solid diet, was able to void spontaneously, have adequate pain control with P.O. pain medication, and could ambulate without difficulty.   The patient will follow up with [place] for post op check and ureteral stent removal.  Condition at Discharge: Improved  Discharge Medications:  Allergies as of 02/05/2019   No Known Allergies     Medication List    STOP taking these medications   ondansetron 8 MG disintegrating tablet Commonly known as: Zofran ODT   oxybutynin 5 MG tablet Commonly known as: DITROPAN     TAKE these medications   ibuprofen 200 MG tablet Commonly known as: ADVIL Take 800 mg by mouth every 6 (six) hours as needed for moderate pain.   Oxycodone HCl 10 MG Tabs Take 1 tablet (10 mg total) by mouth every 4 (four) hours as needed.   Vitamin D (Ergocalciferol) 1.25 MG (50000 UT) Caps capsule Commonly known as: DRISDOL Take 50,000 Units by mouth every Monday.

## 2019-02-05 NOTE — Progress Notes (Signed)
Urology Progress Note   1 Day Post-Op status post left PCNL with antegrade stent placement  Subjective: Patient was uncomfortable overnight and did not sleep well.  Catheter draining well and no issues with vitals.  Postoperative CT scan shows 2 small stone fragments adjacent to the stent.  No postoperative perinephric hematoma  Objective: Vital signs in last 24 hours: Temp:  [97.3 F (36.3 C)-98.8 F (37.1 C)] 98.1 F (36.7 C) (07/21 0720) Pulse Rate:  [62-111] 85 (07/21 0720) Resp:  [11-23] 20 (07/21 0720) BP: (111-152)/(52-87) 123/85 (07/21 0720) SpO2:  [96 %-100 %] 96 % (07/21 0720) Weight:  [102.3 kg] 102.3 kg (07/20 1825)  Intake/Output from previous day: 07/20 0701 - 07/21 0700 In: 1529 [I.V.:1429; IV Piggyback:100] Out: 2410 [Urine:2400; Blood:10] Intake/Output this shift: No intake/output data recorded.  Physical Exam:  General: Alert and oriented CV: RRR Lungs: Normal work of breathing Abdomen: Soft, nontender GU: Left flank incision healing well. Foley in place draining light red urine Ext: NT, No erythema  Lab Results: Recent Labs    02/04/19 0800  HGB 11.5*  HCT 37.4   BMET Recent Labs    02/04/19 0800  NA 137  K 4.2  CL 104  CO2 23  GLUCOSE 92  BUN 11  CREATININE 0.64  CALCIUM 9.0     Studies/Results: Ct Abdomen Wo Contrast  Result Date: 02/04/2019 CLINICAL DATA:  Status post percutaneous nephrolithotomy. EXAM: CT ABDOMEN WITHOUT CONTRAST TECHNIQUE: Multidetector CT imaging of the abdomen was performed following the standard protocol without IV contrast. COMPARISON:  CT scan 10/21/2018 FINDINGS: Lower chest: Moderate bibasilar atelectasis. No definite pleural effusions and no pericardial effusion. Hepatobiliary: No focal hepatic lesions or intrahepatic biliary dilatation. Suspect small gallstones in the gallbladder. No common bile duct dilatation. Pancreas: No mass, inflammation or ductal dilatation. Spleen: Normal size.  No focal lesions.  Adrenals/Urinary Tract: The adrenal glands are normal. The right kidney is normal. No renal calculi or hydronephrosis. The left kidney contains a double-J ureteral stent. There is mild residual hydronephrosis. A few small stone fragments are noted in the collecting system along with a few dots of air. Perinephric interstitial changes and a small amount of perinephric fluid. Stomach/Bowel: The stomach, duodenum, visualized small bowel and visualized colon are unremarkable. Vascular/Lymphatic: The aorta is normal in caliber. No atheroscerlotic calcifications. No mesenteric of retroperitoneal mass or adenopathy. Small scattered lymph nodes are noted. Other: No free air or significant free fluid collections. Musculoskeletal: No significant bony findings. IMPRESSION: 1. Moderate bibasilar atelectasis. 2. Double-J ureteral stent is noted on the left side. There are few small stone fragments noted in the left renal collecting system and mild residual hydronephrosis. 3. Normal right kidney. Electronically Signed   By: Marijo Sanes M.D.   On: 02/04/2019 20:05   Dg C-arm 1-60 Min-no Report  Result Date: 02/04/2019 Fluoroscopy was utilized by the requesting physician.  No radiographic interpretation.   Ir Ureteral Stent Left New Access W/o Sep Nephrostomy Cath  Result Date: 02/04/2019 INDICATION: Renal stones, access for left percutaneous nephrolithotomy. EXAM: 1. Percutaneous puncture of the renal collecting system under fluoroscopic guidance 2. Placement of a percutaneous nephrostomy tube Operating Physician:  Criselda Peaches, MD COMPARISON:  CT of the abdomen and pelvis-10/21/2018 MEDICATIONS: 2 g Ancef; The antibiotic was administered in an appropriate time frame prior to skin puncture. ANESTHESIA/SEDATION: Fentanyl 100 mcg IV; Versed 6 mg IV Moderate Sedation Time:  17 minutes The patient was continuously monitored during the procedure by the interventional radiology nurse  under my direct supervision.  CONTRAST:  15mL OMNIPAQUE IOHEXOL 300 MG/ML SOLN - administered into the collecting system(s) FLUOROSCOPY TIME:  Fluoroscopy Time: 5 minutes 36 seconds (170 mGy). COMPLICATIONS: None immediate. PROCEDURE: Informed written consent was obtained from the patient after a thorough discussion of the procedural risks, benefits and alternatives. All questions were addressed. Maximal Sterile Barrier Technique was utilized including caps, mask, sterile gowns, sterile gloves, sterile drape, hand hygiene and skin antiseptic. A timeout was performed prior to the initiation of the procedure. A pre procedural spot fluoroscopic image was obtained of the upper abdomen. Ultrasound scanning performed of the kidney was negative for significant hydronephrosis. As such, the stone within left lower pole was targeted fluoroscopically with a 22 gauge Chiba needle. Access to the collecting system was confirmed with advancement of a Nitrex wire into the collecting system. The needle was exchanged for the inner 3 French catheter from an Accustick set and contrast injection confirmed access. Contrast was injected into the renal collecting system. This confirms that the initial percutaneous access is via the lower pole calyx containing the stone. The 0.018 wire was then readvanced and coiled in the renal pelvis. An Accustick set was utilized to dilate the tract and was subsequently exchanged for a Kumpe catheter over a Bentson wire. The Kumpe catheter was advanced down the ureter and into the urinary bladder. Postprocedural spot radiographs were obtained in various obliquities and the catheter was sutured to the skin. The catheter was capped and a dressing was placed. The patient tolerated the procedure well without immediate postprocedural complication. IMPRESSION: Successful fluoroscopic guided left percutaneous nephrostomy with placement of a 5 French Kumpe catheter to the level of the urinary bladder to be utilized during impending  nephrolithotomy procedure. Of note, the patient's existing double-J ureteral stent was pushed into the bladder during the procedure. Electronically Signed   By: Malachy MoanHeath  McCullough M.D.   On: 02/04/2019 11:05    Assessment/Plan:  33 y.o. female s/p  left PCNL with antegrade stent placement.  2 small stone fragments on postoperative CT   -Discontinue fluids -Discontinue catheter, trial of void -Transition to p.o. Bactrim empirically until culture results - Discussed postoperative treatment options which include Second Look ureteroscopy versus observation with stent removal. Final plan TBD - Plan for d/c home later this AM.  Dispo: Home   LOS: 0 days   Federico FlakeSolomon Kenza Munar 02/05/2019, 7:22 AM

## 2019-12-02 ENCOUNTER — Ambulatory Visit: Payer: BC Managed Care – PPO | Admitting: Urology

## 2020-06-04 ENCOUNTER — Other Ambulatory Visit: Payer: Self-pay

## 2020-06-04 DIAGNOSIS — N200 Calculus of kidney: Secondary | ICD-10-CM

## 2020-06-04 MED ORDER — POTASSIUM CITRATE ER 15 MEQ (1620 MG) PO TBCR: 1.0000 | EXTENDED_RELEASE_TABLET | Freq: Two times a day (BID) | ORAL | 11 refills | Status: DC

## 2020-09-25 ENCOUNTER — Other Ambulatory Visit: Payer: Self-pay

## 2020-09-25 DIAGNOSIS — Z87442 Personal history of urinary calculi: Secondary | ICD-10-CM

## 2020-09-28 ENCOUNTER — Ambulatory Visit (INDEPENDENT_AMBULATORY_CARE_PROVIDER_SITE_OTHER): Payer: BC Managed Care – PPO | Admitting: Urology

## 2020-09-28 ENCOUNTER — Ambulatory Visit (HOSPITAL_COMMUNITY)
Admission: RE | Admit: 2020-09-28 | Discharge: 2020-09-28 | Disposition: A | Discharge status: Home / Self Care | Payer: BC Managed Care – PPO | Source: Ambulatory Visit | Attending: Urology | Admitting: Urology

## 2020-09-28 ENCOUNTER — Other Ambulatory Visit: Payer: Self-pay

## 2020-09-28 ENCOUNTER — Encounter: Payer: Self-pay | Admitting: Urology

## 2020-09-28 VITALS — BP 127/83 | HR 86 | Temp 98.3°F | Ht 62.0 in | Wt 226.0 lb

## 2020-09-28 DIAGNOSIS — Z87442 Personal history of urinary calculi: Secondary | ICD-10-CM | POA: Diagnosis present

## 2020-09-28 DIAGNOSIS — R82991 Hypocitraturia: Secondary | ICD-10-CM | POA: Diagnosis not present

## 2020-09-28 DIAGNOSIS — N200 Calculus of kidney: Secondary | ICD-10-CM

## 2020-09-28 LAB — URINALYSIS, ROUTINE W REFLEX MICROSCOPIC
Bilirubin, UA: NEGATIVE
Glucose, UA: NEGATIVE
Ketones, UA: NEGATIVE
Leukocytes,UA: NEGATIVE
Nitrite, UA: NEGATIVE
Protein,UA: NEGATIVE
RBC, UA: NEGATIVE
Specific Gravity, UA: 1.02 (ref 1.005–1.030)
Urobilinogen, Ur: 0.2 mg/dL (ref 0.2–1.0)
pH, UA: 7.5 (ref 5.0–7.5)

## 2020-09-28 LAB — BLADDER SCAN AMB NON-IMAGING: Scan Result: 17

## 2020-09-28 MED ORDER — POTASSIUM CITRATE ER 15 MEQ (1620 MG) PO TBCR: 1.0000 | EXTENDED_RELEASE_TABLET | Freq: Two times a day (BID) | ORAL | 3 refills | Status: AC

## 2020-09-28 MED ORDER — TAMSULOSIN HCL 0.4 MG PO CAPS: 0.4000 mg | ORAL_CAPSULE | Freq: Every day | ORAL | 11 refills | Status: DC

## 2020-09-28 NOTE — Patient Instructions (Signed)

## 2020-09-28 NOTE — Progress Notes (Signed)
Bladder Scan Patient can void: 17 ml Performed By: Bridgette Habermann, lpn    Urological Symptom Review  Patient is experiencing the following symptoms: Frequent urination Hard to postpone urination Get up at night to urinate Leakage of urine (when coughing and sneezing) Hx of kidney stones   Review of Systems  Gastrointestinal (upper)  : Negative for upper GI symptoms  Gastrointestinal (lower) : Negative for lower GI symptoms  Constitutional : Negative for symptoms  Skin: Negative for skin symptoms  Eyes: Negative for eye symptoms  Ear/Nose/Throat : Negative for Ear/Nose/Throat symptoms  Hematologic/Lymphatic: Negative for Hematologic/Lymphatic symptoms  Cardiovascular : Negative for cardiovascular symptoms  Respiratory : Negative for respiratory symptoms  Endocrine: Negative for endocrine symptoms  Musculoskeletal: Negative for musculoskeletal symptoms  Neurological: Negative for neurological symptoms  Psychologic: Negative for psychiatric symptoms

## 2020-09-28 NOTE — Progress Notes (Signed)
09/28/2020 9:20 AM   [name] 09-May-1986 [id number]  Referring provider: Erasmo Downer, NP [id number] [place],  [place] [address]  nephrolithiasis  HPI: [name] is a 34yo here for followup for nephrolithiasis She was previously seen by Dr. Jerre Simon and then Dr. Vernie Ammons. She has a hx of ureteroscopy in 2018 and then PCNL in 01/2019. She is managed with UrocitK BID. No stone events since she last saw Dr. Vernie Ammons in March 2021. No flank pain. KUB from today shows no calculi.    PMH: Past Medical History:  Diagnosis Date  . Anxiety   . Back pain   . High cholesterol   . History of kidney stones   . Renal disorder     Surgical History: Past Surgical History:  Procedure Laterality Date  . ABDOMINAL HYSTERECTOMY    . CYSTOSCOPY Left 02/04/2019   Procedure: CYSTOSCOPY flexible removal of left ureteral stent;  Surgeon: Ihor Gully, MD;  Location: [facility];  Service: Urology;  Laterality: Left;  . CYSTOSCOPY WITH RETROGRADE PYELOGRAM, URETEROSCOPY AND STENT PLACEMENT Left 10/17/2014   Procedure: CYSTOSCOPY AND URETERAL DILATION WITH LEFT RETROGRADE PYELOGRAM, URETEROSCOPY AND STENT PLACEMENT;  Surgeon: Ihor Gully, MD;  Location: [facility];  Service: Urology;  Laterality: Left;  . CYSTOSCOPY WITH STENT PLACEMENT Left 10/21/2018   Procedure: CYSTOSCOPY WITH  JJ STENT PLACEMENT;  Surgeon: Marcine Matar, MD;  Location: [facility];  Service: Urology;  Laterality: Left;  . HOLMIUM LASER APPLICATION Left 10/17/2014   Procedure: HOLMIUM LASER LITHOTRIPSY ;  Surgeon: Ihor Gully, MD;  Location: [facility];  Service: Urology;  Laterality: Left;  . IR URETERAL STENT LEFT NEW ACCESS W/O SEP NEPHROSTOMY CATH  02/04/2019  . NEPHROLITHOTOMY Left 02/04/2019   Procedure: NEPHROLITHOTOMY PERCUTANEOUS;  Surgeon: Ihor Gully, MD;  Location: [facility];  Service: Urology;  Laterality: Left;  . STONE EXTRACTION WITH BASKET    . TUBAL LIGATION      Home  Medications:  Allergies as of 09/28/2020      Reactions   Vicodin Hp [hydrocodone-acetaminophen] Nausea Only      Medication List       Accurate as of September 28, 2020  9:20 AM. If you have any questions, ask your nurse or doctor.        STOP taking these medications   Oxycodone HCl 10 MG Tabs Stopped by: Wilkie Aye, MD     TAKE these medications   escitalopram 10 MG tablet Commonly known as: LEXAPRO   ibuprofen 200 MG tablet Commonly known as: ADVIL Take 800 mg by mouth every 6 (six) hours as needed for moderate pain.   Potassium Citrate 15 MEQ (1620 MG) Tbcr Take 1 each by mouth 2 (two) times daily.   Vitamin D (Ergocalciferol) 1.25 MG (50000 UNIT) Caps capsule Commonly known as: DRISDOL Take 50,000 Units by mouth every Monday.       Allergies:  Allergies  Allergen Reactions  . Vicodin Hp [Hydrocodone-Acetaminophen] Nausea Only    Family History: History reviewed. No pertinent family history.  Social History:  reports that she has never smoked. She has never used smokeless tobacco. She reports that she does not drink alcohol and does not use drugs.  ROS: All other review of systems were reviewed and are negative except what is noted above in HPI  Physical Exam: BP 127/83   Pulse 86   Temp 98.3 F (36.8 C)   Ht 5\' 2"  (1.575 m)   Wt 226 lb (  102.5 kg)   LMP 07/01/2014 Comment: neg preg  BMI 41.34 kg/m   Constitutional:  Alert and oriented, No acute distress. HEENT: [place] AT, moist mucus membranes.  Trachea midline, no masses. Cardiovascular: No clubbing, cyanosis, or edema. Respiratory: Normal respiratory effort, no increased work of breathing. GI: Abdomen is soft, nontender, nondistended, no abdominal masses GU: No CVA tenderness.  Lymph: No cervical or inguinal lymphadenopathy. Skin: No rashes, bruises or suspicious lesions. Neurologic: Grossly intact, no focal deficits, moving all 4 extremities. Psychiatric: Normal mood and affect.  Laboratory  Data: Lab Results  Component Value Date   WBC 8.3 02/04/2019   HGB 11.5 (L) 02/04/2019   HCT 37.4 02/04/2019   MCV 85.6 02/04/2019   PLT 362 02/04/2019    Lab Results  Component Value Date   CREATININE 0.64 02/04/2019    No results found for: PSA  No results found for: TESTOSTERONE  No results found for: HGBA1C  Urinalysis    Component Value Date/Time   COLORURINE YELLOW 10/21/2018 1351   APPEARANCEUR HAZY (A) 10/21/2018 1351   LABSPEC 1.013 10/21/2018 1351   PHURINE 6.0 10/21/2018 1351   GLUCOSEU NEGATIVE 10/21/2018 1351   HGBUR SMALL (A) 10/21/2018 1351   BILIRUBINUR NEGATIVE 10/21/2018 1351   KETONESUR 5 (A) 10/21/2018 1351   PROTEINUR NEGATIVE 10/21/2018 1351   UROBILINOGEN 0.2 10/15/2014 1454   NITRITE NEGATIVE 10/21/2018 1351   LEUKOCYTESUR LARGE (A) 10/21/2018 1351    Lab Results  Component Value Date   BACTERIA RARE (A) 10/21/2018    Pertinent Imaging: KUb today: Images reviewed and discussed with the patient  Results for orders placed during the hospital encounter of 10/17/14  KUB day of procedure  Narrative CLINICAL DATA:  Pre lithotripsy study  EXAM: ABDOMEN - 1 VIEW  COMPARISON:  KUB of October 16, 2014  FINDINGS: Again demonstrated is a 6 x 9 mm calcified stone at the level of the junction of the proximal and mid left ureter. It overlies the tip of the left L4 transverse process. No other ureteral stones are demonstrated. No definite stones overlying the kidneys nor bladder are seen. The bowel gas pattern is normal.  IMPRESSION: Persistent 6 x 9 mm stone at the junction of the proximal and mid left ureter.   Electronically Signed By: David  [place] On: 10/17/2014 08:40  No results found for this or any previous visit.  No results found for this or any previous visit.  No results found for this or any previous visit.  No results found for this or any previous visit.  No results found for this or any previous visit.  No  results found for this or any previous visit.  Results for orders placed during the hospital encounter of 10/21/18  CT Renal Stone Study  Narrative CLINICAL DATA:  Left flank pain 1 week with hematuria.  EXAM: CT ABDOMEN AND PELVIS WITHOUT CONTRAST  TECHNIQUE: Multidetector CT imaging of the abdomen and pelvis was performed following the standard protocol without IV contrast.  COMPARISON:  10/15/2014  FINDINGS: Lower chest: Lung bases are normal.  Hepatobiliary: Liver, gallbladder and biliary tree are normal.  Pancreas: Normal.  Spleen: Normal.  Adrenals/Urinary Tract: Adrenal glands are normal. Kidneys are normal in size. Mild-to-moderate left-sided hydronephrosis. Two stones over the lower pole left intrarenal collecting system with the larger measuring 1.5 cm. Large stone over the region of the left UPJ measuring 2.9 cm causing this obstruction. Minimal left perinephric inflammatory change. Remainder of the ureters and bladder are normal.  Stomach/Bowel: Stomach and small bowel are normal. Appendix is normal. Colon is normal.  Vascular/Lymphatic: Vascular structures are within normal. Several small left periaortic with the largest measuring 1.1 cm by short axis increased in size compared to the previous exam and may be reactive.  Reproductive: Previous hysterectomy.  Ovaries are normal.  Other: No free fluid.  Musculoskeletal: Unremarkable.  IMPRESSION: Left-sided nephrolithiasis. 2.9 cm stone at the left UPJ causing mild to moderate grade obstruction.  Slight increase in size of several left periaortic lymph nodes with the largest measuring 1.1 cm likely reactive.   Electronically Signed By: Elberta Fortis M.D. On: 10/21/2018 15:06   Assessment & Plan:    1. Hypocitraturia -continue UrocitK BID  2. History of kidney stones -RTC 1 year with KUB - Urinalysis, Routine w reflex microscopic - BLADDER SCAN AMB NON-IMAGING   No follow-ups on  file.  Wilkie Aye, MD  [facility] [place]

## 2020-09-28 NOTE — Addendum Note (Signed)
Addended by: Malen Gauze on: 09/28/2020 09:27 AM   Modules accepted: Orders

## 2021-10-04 ENCOUNTER — Ambulatory Visit: Payer: BC Managed Care – PPO | Admitting: Urology

## 2021-10-08 ENCOUNTER — Ambulatory Visit: Payer: BC Managed Care – PPO | Admitting: Urology

## 2022-04-01 ENCOUNTER — Other Ambulatory Visit: Payer: Self-pay

## 2022-04-01 ENCOUNTER — Encounter (INDEPENDENT_AMBULATORY_CARE_PROVIDER_SITE_OTHER): Payer: Self-pay | Admitting: *Deleted

## 2022-04-01 VITALS — BP 113/78 | HR 73 | Temp 98.4°F | Ht 62.0 in | Wt 225.1 lb

## 2022-04-01 DIAGNOSIS — Z006 Encounter for examination for normal comparison and control in clinical research program: Secondary | ICD-10-CM

## 2022-04-01 NOTE — Research (Signed)
[name] was here for screening for the Purpose 2 study. They identify as non binary and has had more than 2 female partners in the past 12 weeks with unprotected receptive anal sex. They don't remember if they  ever had an HIV test and has never taken Prep. They reviewed a copy of both consents prior to the visit and we reviewed them together prior to obtaining consent. 1st rapid test was invalid so we had to repeat it and the second one was negative. They do have a history of anxiety, vitamin D deficiency, hysterectomy and kidney stones. Planned entry is for 04/22/22 if eligibility is confirmed after lab results received.

## 2022-04-02 LAB — RPR: RPR Ser Ql: NONREACTIVE

## 2022-04-26 ENCOUNTER — Encounter (INDEPENDENT_AMBULATORY_CARE_PROVIDER_SITE_OTHER): Payer: Self-pay | Admitting: *Deleted

## 2022-04-26 ENCOUNTER — Other Ambulatory Visit: Payer: Self-pay

## 2022-04-26 ENCOUNTER — Ambulatory Visit (INDEPENDENT_AMBULATORY_CARE_PROVIDER_SITE_OTHER): Payer: BC Managed Care – PPO | Admitting: Pharmacist

## 2022-04-26 VITALS — BP 128/84 | HR 67 | Temp 98.1°F | Wt 220.0 lb

## 2022-04-26 DIAGNOSIS — Z006 Encounter for examination for normal comparison and control in clinical research program: Secondary | ICD-10-CM

## 2022-04-26 DIAGNOSIS — Z79899 Other long term (current) drug therapy: Secondary | ICD-10-CM

## 2022-04-26 MED ORDER — STUDY - PURPOSE 2 - EMTRICITABINE/TENOFOVIR DISOPROXIL FUMARATE 200-300 MG (TRUVADA) OR PLACEBO TABLET (PI-VAN DAM): 1.0000 | ORAL_TABLET | Freq: Every day | ORAL | 0 refills | Status: DC

## 2022-04-26 MED ORDER — STUDY - PURPOSE 2 - LENACAPAVIR 300 MG OR PLACEBO TABLET (PI-VAN DAM): 600.0000 mg | ORAL_TABLET | Freq: Every day | ORAL | 0 refills | Status: DC

## 2022-04-26 MED ORDER — STUDY - PURPOSE 2 - LENACAPAVIR 309 MG/ML OR PLACEBO INJECTION (PI-VAN DAM): 927.0000 mg | INJECTION | Freq: Once | SUBCUTANEOUS | 0 refills | Status: AC

## 2022-04-26 MED ORDER — STUDY - PURPOSE 2 - LENACAPAVIR 309 MG/ML OR PLACEBO INJECTION (PI-VAN DAM)
927.0000 mg | INJECTION | Freq: Once | SUBCUTANEOUS | Status: AC
  Administered 2022-04-26: 927 mg via SUBCUTANEOUS
  Filled 2022-04-26: qty 3

## 2022-04-26 NOTE — Research (Signed)
[name] was here to enroll in the Purpose 2 study. After confirming eligibility and a negative rapid HIV test, they were enrolled on study. They took 2 lenacapavir/placebo pills and 1 truvada/placebo pill here and then received 2 leneacapvir injections in their abdomen without any problem. They started on wegovy about 1 week ago and also had some allergy symptoms. They took benadryl for that and it has cleared up. They were given 2 more lenacapavir pills to take tomorrow as well as truvada to take every day. They were instructed to call for any bothersome side effects or any other questions. They will be returning in 4 weeks.

## 2022-04-26 NOTE — Progress Notes (Signed)
   04/26/2022  HPI: [name] is a 36 y.o. female who presents for PURPOSE 2 injection administration  Lenacapavir/placebo injection #1 given in lower right quadrant of the abdomen. Lenacapavir/placebo injection #2 given at least 4 inches away in a counterclockwise manner. Patient was told to turn head during administration and blinding was maintained throughout visit. A sterile gauze was applied immediately to injection sites once injection was administered to block any potential unblinding.  Patient tolerated well. Documented injection timing and other pertinent information per research requirements in the research flowchart.    Alfonse Spruce, PharmD, CPP Clinical Pharmacist Practitioner Infectious Diseases [facility]  04/26/2022, 11:04 AM

## 2022-04-27 LAB — RPR: RPR Ser Ql: NONREACTIVE

## 2022-05-24 ENCOUNTER — Encounter: Payer: BC Managed Care – PPO | Admitting: *Deleted

## 2022-05-25 ENCOUNTER — Other Ambulatory Visit: Payer: Self-pay

## 2022-05-25 ENCOUNTER — Encounter (INDEPENDENT_AMBULATORY_CARE_PROVIDER_SITE_OTHER): Payer: Self-pay | Admitting: *Deleted

## 2022-05-25 DIAGNOSIS — Z006 Encounter for examination for normal comparison and control in clinical research program: Secondary | ICD-10-CM

## 2022-05-25 MED ORDER — STUDY - PURPOSE 2 - EMTRICITABINE/TENOFOVIR DISOPROXIL FUMARATE 200-300 MG (TRUVADA) OR PLACEBO TABLET (PI-VAN DAM): 1.0000 | ORAL_TABLET | Freq: Every day | ORAL | 0 refills | Status: DC

## 2022-05-25 NOTE — Research (Signed)
[name] was here today for the week 4 visit for Purpose 2. She denies any new problems or meds. They said the study injections were red and a little tender for 2 days. When measured today, the RU abd  nodule was 2 x 1cm and the RL abd site was not palpable. Their adherence 100% with their truvada/placebo. Next appt will be in 4 weeks.

## 2022-06-20 ENCOUNTER — Other Ambulatory Visit: Payer: Self-pay

## 2022-06-20 ENCOUNTER — Encounter (INDEPENDENT_AMBULATORY_CARE_PROVIDER_SITE_OTHER): Payer: Self-pay | Admitting: *Deleted

## 2022-06-20 DIAGNOSIS — Z006 Encounter for examination for normal comparison and control in clinical research program: Secondary | ICD-10-CM

## 2022-06-20 MED ORDER — STUDY - PURPOSE 2 - EMTRICITABINE/TENOFOVIR DISOPROXIL FUMARATE 200-300 MG (TRUVADA) OR PLACEBO TABLET (PI-VAN DAM): 1.0000 | ORAL_TABLET | Freq: Every day | ORAL | 0 refills | Status: DC

## 2022-06-20 NOTE — Research (Signed)
[name] was here for her week 8 visit for Purpose 2. She denies any new problems or medications. She had 100% adherence with her meds and will be returning in 5 weeks.

## 2022-07-26 ENCOUNTER — Other Ambulatory Visit: Payer: Self-pay

## 2022-07-26 ENCOUNTER — Encounter (INDEPENDENT_AMBULATORY_CARE_PROVIDER_SITE_OTHER): Payer: Self-pay

## 2022-07-26 DIAGNOSIS — Z006 Encounter for examination for normal comparison and control in clinical research program: Secondary | ICD-10-CM

## 2022-07-26 MED ORDER — STUDY - PURPOSE 2 - EMTRICITABINE/TENOFOVIR DISOPROXIL FUMARATE 200-300 MG (TRUVADA) OR PLACEBO TABLET (PI-VAN DAM): 1.0000 | ORAL_TABLET | Freq: Every day | ORAL | 0 refills | Status: DC

## 2022-07-26 NOTE — Research (Signed)
Individual seen for Week 13 visit for Purpose 2, a double blind study evaluating the efficacy and safety of long acting lenacapavir for HIV preexposure. It is being compared to Truvada for PREP. For patient care concerns please call [phone]. All procedures carried out per study protocol. Study medications dispensed, excellent adherence. Plan to see individual again in 13 weeks.

## 2022-07-27 LAB — RPR: RPR Ser Ql: NONREACTIVE

## 2022-10-21 ENCOUNTER — Ambulatory Visit (INDEPENDENT_AMBULATORY_CARE_PROVIDER_SITE_OTHER): Payer: BC Managed Care – PPO | Admitting: Pharmacist

## 2022-10-21 ENCOUNTER — Other Ambulatory Visit: Payer: Self-pay

## 2022-10-21 ENCOUNTER — Encounter (INDEPENDENT_AMBULATORY_CARE_PROVIDER_SITE_OTHER): Payer: Self-pay | Admitting: *Deleted

## 2022-10-21 VITALS — BP 109/74 | HR 74 | Temp 98.6°F | Wt 195.8 lb

## 2022-10-21 DIAGNOSIS — Z006 Encounter for examination for normal comparison and control in clinical research program: Secondary | ICD-10-CM

## 2022-10-21 MED ORDER — STUDY - PURPOSE 2 - LENACAPAVIR 309 MG/ML OR PLACEBO INJECTION (PI-VAN DAM)
927.0000 mg | INJECTION | Freq: Once | SUBCUTANEOUS | Status: AC
  Administered 2022-10-21: 927 mg via SUBCUTANEOUS
  Filled 2022-10-21: qty 3

## 2022-10-21 MED ORDER — STUDY - PURPOSE 2 - LENACAPAVIR 309 MG/ML OR PLACEBO INJECTION (PI-VAN DAM): 927.0000 mg | INJECTION | Freq: Once | SUBCUTANEOUS | Status: DC

## 2022-10-21 MED ORDER — STUDY - PURPOSE 2 - EMTRICITABINE/TENOFOVIR DISOPROXIL FUMARATE 200-300 MG (TRUVADA) OR PLACEBO TABLET (PI-VAN DAM): 1.0000 | ORAL_TABLET | Freq: Every day | ORAL | 0 refills | Status: DC

## 2022-10-21 NOTE — Progress Notes (Deleted)
   10/21/2022  HPI: [name] is a 37 y.o. female who presents for PURPOSE 2 injection administration   Patient Active Problem List   Diagnosis Date Noted   Renal calculus, left 02/04/2019   Hydronephrosis    Left flank pain    Acute lower UTI    Obesity, Class III, BMI 40-49.9 (morbid obesity)    Left nephrolithiasis 10/21/2018    Patient's Medications  New Prescriptions   No medications on file  Previous Medications   ESCITALOPRAM (LEXAPRO) 10 MG TABLET       IBUPROFEN (ADVIL) 200 MG TABLET    Take 800 mg by mouth every 6 (six) hours as needed for moderate pain.   POTASSIUM CITRATE 15 MEQ (1620 MG) TBCR    Take 1 each by mouth 2 (two) times daily.   STUDY - PURPOSE 2 - EMTRICITABINE/TENOFOVIR DISOPROXIL FUMARATE (TRUVADA) 200-300 MG OR PLACEBO TABLET (PI-VAN DAM)    Take 1 tablet by mouth daily.   STUDY - PURPOSE 2 - LENACAPAVIR 300 MG OR PLACEBO TABLET (PI-VAN DAM)    Take 2 tablets (600 mg total) by mouth daily.   TAMSULOSIN (FLOMAX) 0.4 MG CAPS CAPSULE    Take 1 capsule (0.4 mg total) by mouth daily.   VITAMIN D, ERGOCALCIFEROL, (DRISDOL) 1.25 MG (50000 UT) CAPS CAPSULE    Take 50,000 Units by mouth every Monday.  Modified Medications   No medications on file  Discontinued Medications   No medications on file    Allergies: Allergies  Allergen Reactions   Vicodin Hp [Hydrocodone-Acetaminophen] Nausea Only    Past Medical History: Past Medical History:  Diagnosis Date   Anxiety    Back pain    High cholesterol    History of kidney stones    Renal disorder     Social History: Social History   Socioeconomic History   Marital status: Divorced    Spouse name: Not on file   Number of children: Not on file   Years of education: Not on file   Highest education level: Not on file  Occupational History   Not on file  Tobacco Use   Smoking status: Never   Smokeless tobacco: Never  Vaping Use   Vaping Use: Never used  Substance and Sexual Activity   Alcohol  use: No   Drug use: No   Sexual activity: Not on file  Other Topics Concern   Not on file  Social History Narrative   Not on file   Social Determinants of Health   Financial Resource Strain: Not on file  Food Insecurity: Not on file  Transportation Needs: Not on file  Physical Activity: Not on file  Stress: Not on file  Social Connections: Not on file     Lenacapavir/placebo injection #1 given in lower right quadrant of the abdomen. Lenacapavir/placebo injection #2 given at least 4 inches away in a counterclockwise manner. Patient was told to turn head during administration and blinding was maintained throughout visit. A sterile gauze was applied immediately to injection sites once injection was administered to block any potential unblinding.  Patient tolerated well. Documented injection timing and other pertinent information per research requirements in the research flowchart.    Blane Ohara, PharmD  PGY1 Pharmacy Resident

## 2022-10-21 NOTE — Research (Signed)
[name] was here today for her week 26 study visit for Purpose 2. She continues to lose weight with the use of wegovy. She also continues to have a persistent nodule at the RUQ injection site from 26 weeks ago. She was given new injections of lenacapavir/placebo without problem. Adherence to Truvada/placebo has been excellent. She will return in 13 weeks for the next study visit.

## 2022-10-21 NOTE — Progress Notes (Signed)
   10/21/2022  HPI: Vian Denaro is a 37 y.o. female who presents for PURPOSE 2 injection administration  Lenacapavir/placebo injection #1 given in lower left quadrant of the abdomen. Lenacapavir/placebo injection #2 given at least 4 inches away in a counterclockwise manner. Patient was told to turn head during administration and blinding was maintained throughout visit. A sterile gauze was applied immediately to injection sites once injection was administered to block any potential unblinding.  Patient tolerated well. Documented injection timing and other pertinent information per research requirements in the research flowchart.    Margarite Gouge, PharmD, CPP, BCIDP, AAHIVP Clinical Pharmacist Practitioner Infectious Diseases Clinical Pharmacist [facility]  10/21/2022, 10:49 AM

## 2022-10-22 LAB — RPR: RPR Ser Ql: NONREACTIVE

## 2023-01-27 ENCOUNTER — Encounter (INDEPENDENT_AMBULATORY_CARE_PROVIDER_SITE_OTHER): Payer: Self-pay | Admitting: *Deleted

## 2023-01-27 ENCOUNTER — Other Ambulatory Visit: Payer: Self-pay

## 2023-01-27 DIAGNOSIS — Z006 Encounter for examination for normal comparison and control in clinical research program: Secondary | ICD-10-CM

## 2023-01-27 MED ORDER — STUDY - PURPOSE 2 - EMTRICITABINE/TENOFOVIR DISOPROXIL FUMARATE 200-300 MG (TRUVADA) OR PLACEBO TABLET (PI-VAN DAM): 1.0000 | ORAL_TABLET | Freq: Every day | ORAL | 0 refills | Status: DC

## 2023-01-30 NOTE — Research (Signed)
[name] was here for her week 39 visit for Purpose 2 study. She denied any problems from the lenacapavir/placebo injectins at the last visit. No nodules palpated. There is still one persisting from the first set of injections she got at entry. She denies any new problems or medications currently. She will be returning in 13 weeks for her next study visit

## 2023-01-31 LAB — RPR: RPR Ser Ql: NONREACTIVE

## 2023-04-21 ENCOUNTER — Encounter: Payer: BC Managed Care – PPO | Admitting: *Deleted

## 2023-04-21 ENCOUNTER — Other Ambulatory Visit: Payer: Self-pay

## 2023-04-21 VITALS — BP 111/79 | HR 74 | Temp 98.2°F | Wt 199.4 lb

## 2023-04-21 DIAGNOSIS — Z006 Encounter for examination for normal comparison and control in clinical research program: Secondary | ICD-10-CM

## 2023-04-21 MED ORDER — STUDY - PURPOSE 2 (OPEN-LABEL) - LENACAPAVIR 309 MG/ML INJECTION (PI-VAN DAM)
927.0000 mg | INJECTION | Freq: Once | SUBCUTANEOUS | Status: AC
  Administered 2023-04-21: 927 mg via SUBCUTANEOUS
  Filled 2023-04-21: qty 3

## 2023-04-22 LAB — RPR: RPR Ser Ql: NONREACTIVE

## 2023-04-24 NOTE — Research (Signed)
[name] was here for her ERBP/OLE Day 1 visit for Purpose 2. She has decided to remain on study in the OLE arm. She was unblinded to the active lenacapavir arm and was given 2 injections of active len today without any problem. She will be returning in 13 weeks for the next visit.

## 2023-07-21 ENCOUNTER — Encounter: Payer: Self-pay | Admitting: *Deleted

## 2023-07-21 ENCOUNTER — Other Ambulatory Visit: Payer: Self-pay

## 2023-07-21 VITALS — BP 144/90 | HR 73 | Temp 98.3°F | Wt 233.8 lb

## 2023-07-21 DIAGNOSIS — Z006 Encounter for examination for normal comparison and control in clinical research program: Secondary | ICD-10-CM

## 2023-07-21 NOTE — Research (Signed)
 [name] was here for her week 13 OLE visit for Purpose 2. She had stopped Wegovy in November and her weight has climbed back up. She is hoping that the new insurance for this year will approve it for weight loss. Her BP is also up and when rechecked it was still 146/90. She was referred to her PCP for followup for elevated BP. She has one palpable nodule from the last injection on the RUQ abdomen and denies any other problems with it. She will be returning in 13 weeks for the next visit.

## 2023-07-22 LAB — RPR: RPR Ser Ql: NONREACTIVE

## 2023-10-23 ENCOUNTER — Encounter

## 2023-10-23 ENCOUNTER — Other Ambulatory Visit: Payer: Self-pay

## 2023-10-23 DIAGNOSIS — Z006 Encounter for examination for normal comparison and control in clinical research program: Secondary | ICD-10-CM

## 2023-10-23 MED ORDER — STUDY - PURPOSE 2 (OPEN-LABEL) - LENACAPAVIR 309 MG/ML INJECTION (PI-VAN DAM)
927.0000 mg | INJECTION | Freq: Once | SUBCUTANEOUS | Status: AC
  Administered 2023-10-23: 927 mg via SUBCUTANEOUS
  Filled 2023-10-23: qty 3

## 2023-10-23 NOTE — Research (Signed)
 Individual seen for research visit OLE week 26 for Purpose 2.  Reconsent required, Inform Consent explained/reviewed, risk, benefits, responsibilities and other options were reviewed. Questions answered, comprehension of the study was assessed and adequate time to consider options was provided. Individual verbalized understanding and signed the informed consent witnessed by study coordinator. Signed consent was obtained prior to any procedures being done. Copy provided to participant. Overall individual is doing well. All procedures carried out per protocol. Study medications administered. Plan to see participant again in 13 weeks.

## 2023-10-24 LAB — RPR: RPR Ser Ql: NONREACTIVE

## 2024-01-15 ENCOUNTER — Encounter

## 2024-01-15 ENCOUNTER — Other Ambulatory Visit: Payer: Self-pay

## 2024-01-15 VITALS — BP 124/83 | HR 73 | Temp 97.5°F | Resp 16 | Wt 228.6 lb

## 2024-01-15 DIAGNOSIS — Z006 Encounter for examination for normal comparison and control in clinical research program: Secondary | ICD-10-CM

## 2024-01-16 LAB — RPR: RPR Ser Ql: NONREACTIVE

## 2024-01-17 NOTE — Research (Signed)
 Individual seen for research visit OLE week 39 for Purpose 2. Overall individual is doing well. All procedures carried out per protocol. Plan to see participant again in 13 weeks.

## 2024-04-12 ENCOUNTER — Encounter

## 2024-04-15 ENCOUNTER — Encounter

## 2024-04-16 ENCOUNTER — Other Ambulatory Visit: Payer: Self-pay

## 2024-04-16 ENCOUNTER — Encounter

## 2024-04-16 DIAGNOSIS — Z006 Encounter for examination for normal comparison and control in clinical research program: Secondary | ICD-10-CM

## 2024-04-16 MED ORDER — STUDY - PURPOSE 2 (OPEN-LABEL) - LENACAPAVIR 309 MG/ML INJECTION (PI-VAN DAM): 927.0000 mg | INJECTION | Freq: Once | SUBCUTANEOUS | Status: DC

## 2024-04-16 MED ORDER — STUDY - PURPOSE 2 (OPEN-LABEL) - LENACAPAVIR 309 MG/ML INJECTION (PI-VAN DAM): 927.0000 mg | INJECTION | Freq: Once | SUBCUTANEOUS | 0 refills | Status: DC

## 2024-04-16 MED ORDER — STUDY - PURPOSE 2 (OPEN-LABEL) - LENACAPAVIR 309 MG/ML INJECTION (PI-VAN DAM)
927.0000 mg | INJECTION | Freq: Once | SUBCUTANEOUS | Status: AC
  Administered 2024-04-16: 927 mg via SUBCUTANEOUS
  Filled 2024-04-16: qty 3

## 2024-04-16 NOTE — Research (Signed)
 Individual seen for research visit OLE week 39 for Purpose 2. Overall individual is doing well. All procedures carried out per protocol. Plan to see participant again in 13 weeks.

## 2024-04-17 LAB — RPR: RPR Ser Ql: NONREACTIVE

## 2024-05-13 ENCOUNTER — Encounter

## 2024-07-15 ENCOUNTER — Encounter

## 2024-07-16 ENCOUNTER — Other Ambulatory Visit: Payer: Self-pay

## 2024-07-16 ENCOUNTER — Encounter

## 2024-07-16 VITALS — BP 134/84 | HR 82 | Temp 98.1°F | Resp 16 | Wt 224.2 lb

## 2024-07-16 DIAGNOSIS — Z006 Encounter for examination for normal comparison and control in clinical research program: Secondary | ICD-10-CM

## 2024-07-16 NOTE — Research (Signed)
 Individual seen for research visit OLE Wk 65 for Purpose 2, The goal of this clinical study is to test how well the study drug, lenacapavir  (LEN), works in preventing the risk of HIV. Overall individual is doing well. All procedures carried out per protocol.  Plan to see participant again in 13 weeks.

## 2024-07-17 LAB — SYPHILIS: RPR W/REFLEX TO RPR TITER AND TREPONEMAL ANTIBODIES, TRADITIONAL SCREENING AND DIAGNOSIS ALGORITHM: RPR Ser Ql: NONREACTIVE

## 2024-07-24 ENCOUNTER — Encounter
